# Patient Record
Sex: Female | Born: 1990 | Race: White | Hispanic: Yes | Marital: Married | Smoking: Never smoker
Health system: Southern US, Community
[De-identification: ages and names within clinical notes are randomized; demographics above are authoritative.]

## PROBLEM LIST (undated history)

## (undated) ENCOUNTER — Inpatient Hospital Stay (HOSPITAL_COMMUNITY): Payer: Self-pay

## (undated) DIAGNOSIS — D649 Anemia, unspecified: Secondary | ICD-10-CM

## (undated) DIAGNOSIS — N83209 Unspecified ovarian cyst, unspecified side: Secondary | ICD-10-CM

## (undated) DIAGNOSIS — Z8759 Personal history of other complications of pregnancy, childbirth and the puerperium: Secondary | ICD-10-CM

## (undated) HISTORY — PX: CHOLECYSTECTOMY: SHX55

## (undated) HISTORY — PX: OVARIAN CYST SURGERY: SHX726

---

## 2018-08-24 ENCOUNTER — Encounter (HOSPITAL_COMMUNITY): Payer: Self-pay | Admitting: *Deleted

## 2018-08-24 ENCOUNTER — Inpatient Hospital Stay (HOSPITAL_COMMUNITY): Payer: Self-pay

## 2018-08-24 ENCOUNTER — Inpatient Hospital Stay (HOSPITAL_COMMUNITY)
Admission: AD | Admit: 2018-08-24 | Discharge: 2018-08-24 | Disposition: A | Discharge status: Home / Self Care | Payer: Self-pay | Source: Ambulatory Visit | Attending: Obstetrics and Gynecology | Admitting: Obstetrics and Gynecology

## 2018-08-24 DIAGNOSIS — O209 Hemorrhage in early pregnancy, unspecified: Secondary | ICD-10-CM

## 2018-08-24 DIAGNOSIS — O3680X0 Pregnancy with inconclusive fetal viability, not applicable or unspecified: Secondary | ICD-10-CM

## 2018-08-24 DIAGNOSIS — O200 Threatened abortion: Secondary | ICD-10-CM | POA: Insufficient documentation

## 2018-08-24 DIAGNOSIS — N83201 Unspecified ovarian cyst, right side: Secondary | ICD-10-CM | POA: Insufficient documentation

## 2018-08-24 DIAGNOSIS — Z87891 Personal history of nicotine dependence: Secondary | ICD-10-CM | POA: Insufficient documentation

## 2018-08-24 DIAGNOSIS — Z3A08 8 weeks gestation of pregnancy: Secondary | ICD-10-CM | POA: Insufficient documentation

## 2018-08-24 DIAGNOSIS — O3481 Maternal care for other abnormalities of pelvic organs, first trimester: Secondary | ICD-10-CM | POA: Insufficient documentation

## 2018-08-24 HISTORY — DX: Unspecified ovarian cyst, unspecified side: N83.209

## 2018-08-24 LAB — WET PREP, GENITAL
Clue Cells Wet Prep HPF POC: NONE SEEN
Sperm: NONE SEEN
TRICH WET PREP: NONE SEEN
Yeast Wet Prep HPF POC: NONE SEEN

## 2018-08-24 LAB — CBC
HEMATOCRIT: 38.3 % (ref 36.0–46.0)
HEMOGLOBIN: 13.1 g/dL (ref 12.0–15.0)
MCH: 28.8 pg (ref 26.0–34.0)
MCHC: 34.2 g/dL (ref 30.0–36.0)
MCV: 84.2 fL (ref 80.0–100.0)
NRBC: 0 % (ref 0.0–0.2)
Platelets: 267 10*3/uL (ref 150–400)
RBC: 4.55 MIL/uL (ref 3.87–5.11)
RDW: 15.7 % — ABNORMAL HIGH (ref 11.5–15.5)
WBC: 11 10*3/uL — AB (ref 4.0–10.5)

## 2018-08-24 LAB — URINALYSIS, ROUTINE W REFLEX MICROSCOPIC
BILIRUBIN URINE: NEGATIVE
GLUCOSE, UA: NEGATIVE mg/dL
KETONES UR: 5 mg/dL — AB
Nitrite: NEGATIVE
Protein, ur: 30 mg/dL — AB
Specific Gravity, Urine: 1.03 (ref 1.005–1.030)
pH: 5 (ref 5.0–8.0)

## 2018-08-24 LAB — HCG, QUANTITATIVE, PREGNANCY: hCG, Beta Chain, Quant, S: 280 m[IU]/mL — ABNORMAL HIGH (ref <5)

## 2018-08-24 MED ORDER — ALUM & MAG HYDROXIDE-SIMETH 200-200-20 MG/5ML PO SUSP
30.0000 mL | Freq: Once | ORAL | Status: AC
  Administered 2018-08-24: 30 mL via ORAL
  Filled 2018-08-24: qty 30

## 2018-08-24 NOTE — Discharge Instructions (Signed)

## 2018-08-24 NOTE — MAU Provider Note (Signed)
  History     CSN: [id number]  Arrival date and time: 08/24/18 1655   First Provider Initiated Contact with Patient 08/24/18 1749      Chief Complaint  Patient presents with  . Abdominal Pain  . Vaginal Bleeding   HPI 27 yo G2P1001 8 1/7 weeks by LMP presents to MAU with c/o vaginal bleeding and epigastric pain. Pt had + UPT at [facility] pregnancy on 08/17/18.  She reports epigastric pain x 1 month. Vaginal bleeding started yesterday. Has tried OTC meds for epigastric pain without relief. Describes pain as constant and sharp. Denies N/V.   H/O TSVD x 1  Denies chronic medications or medical probelms  Past Medical History:  Diagnosis Date  . Ovarian cyst     Past Surgical History:  Procedure Laterality Date  . OVARIAN CYST SURGERY      History reviewed. No pertinent family history.  Social History   Tobacco Use  . Smoking status: Former Games developer  . Smokeless tobacco: Never Used  Substance Use Topics  . Alcohol use: Never    Frequency: Never  . Drug use: Never    Allergies: Not on File  No medications prior to admission.    Review of Systems Physical Exam   Blood pressure (!) 141/87, pulse 84, temperature 98.1 F (36.7 C), temperature source Oral, resp. rate 18, weight 68.5 kg, last menstrual period 06/28/2018.  Physical Exam WDWN female in NAD Lungs clear Heart RRR Abd soft + BS epigastric tenderness no rebound or guarding Pelvic, Nl EGBUS, min blood noted in vaginal vault and coming form os, cultures obtained uterus @ 8 week size, mobile non tender no masses or tenderness MAU Course  Procedures       Care turned over to R. Jeannene Patella 08/24/2018, 6:02 PM

## 2018-08-24 NOTE — MAU Provider Note (Signed)
History     CSN: [id number]  Arrival date and time: 08/24/18 1655   First Provider Initiated Contact with Patient 08/24/18 1749      Chief Complaint  Patient presents with  . Abdominal Pain  . Vaginal Bleeding   HPI  Ms.  Shelly Reed is a 27 y.o. year old G22P1001 female at [redacted]w[redacted]d weeks gestation who presents to MAU reporting RUQ x 2 days. She has been taking Tylenol and "Flanax" for pain with minimal relief. She started VB yesterday that saturated 3 pads yesterday and today. She had a (+) HPT and verified by [facility] (UPT and early U/S documentation brought by patient) on 08/17/2018.   Past Medical History:  Diagnosis Date  . Ovarian cyst     Past Surgical History:  Procedure Laterality Date  . OVARIAN CYST SURGERY      History reviewed. No pertinent family history.  Social History   Tobacco Use  . Smoking status: Former Games developer  . Smokeless tobacco: Never Used  Substance Use Topics  . Alcohol use: Never    Frequency: Never  . Drug use: Never    Allergies: No Known Allergies  No medications prior to admission.    Review of Systems  Constitutional: Negative.   HENT: Negative.   Eyes: Negative.   Respiratory: Negative.   Cardiovascular: Negative.   Gastrointestinal: Positive for abdominal pain (RUQ pain).  Endocrine: Negative.   Genitourinary: Positive for pelvic pain and vaginal bleeding.  Allergic/Immunologic: Negative.   Neurological: Negative.   Hematological: Negative.   Psychiatric/Behavioral: Negative.    Physical Exam   Blood pressure 128/63, pulse 84, temperature 98.1 F (36.7 C), temperature source Oral, resp. rate 18, weight 68.5 kg, last menstrual period 06/28/2018.  Physical Exam Performed by Dr. Alysia Penna -- please see his MAU note MAU Course  Procedures  MDM CCUA UPT CBC ABO/Rh HCG Wet Prep GC/CT -- pending HIV -- pending OB < 14 wks [place] with TV  Results for orders placed or performed during the hospital  encounter of 08/24/18 (from the past 24 hour(s))  Urinalysis, Routine w reflex microscopic     Status: Abnormal   Collection Time: 08/24/18  5:33 PM  Result Value Ref Range   Color, Urine YELLOW YELLOW   APPearance HAZY (A) CLEAR   Specific Gravity, Urine 1.030 1.005 - 1.030   pH 5.0 5.0 - 8.0   Glucose, UA NEGATIVE NEGATIVE mg/dL   Hgb urine dipstick LARGE (A) NEGATIVE   Bilirubin Urine NEGATIVE NEGATIVE   Ketones, ur 5 (A) NEGATIVE mg/dL   Protein, ur 30 (A) NEGATIVE mg/dL   Nitrite NEGATIVE NEGATIVE   Leukocytes, UA SMALL (A) NEGATIVE   RBC / HPF 11-20 0 - 5 RBC/hpf   WBC, UA 11-20 0 - 5 WBC/hpf   Bacteria, UA RARE (A) NONE SEEN   Squamous Epithelial / LPF 11-20 0 - 5   Mucus PRESENT    Amorphous Crystal PRESENT   Wet prep, genital     Status: Abnormal   Collection Time: 08/24/18  6:03 PM  Result Value Ref Range   Yeast Wet Prep HPF POC NONE SEEN NONE SEEN   Trich, Wet Prep NONE SEEN NONE SEEN   Clue Cells Wet Prep HPF POC NONE SEEN NONE SEEN   WBC, Wet Prep HPF POC MANY (A) NONE SEEN   Sperm NONE SEEN   CBC     Status: Abnormal   Collection Time: 08/24/18  6:03 PM  Result Value Ref Range  WBC 11.0 (H) 4.0 - 10.5 K/uL   RBC 4.55 3.87 - 5.11 MIL/uL   Hemoglobin 13.1 12.0 - 15.0 g/dL   HCT 40.9 81.1 - 91.4 %   MCV 84.2 80.0 - 100.0 fL   MCH 28.8 26.0 - 34.0 pg   MCHC 34.2 30.0 - 36.0 g/dL   RDW 78.2 (H) 95.6 - 21.3 %   Platelets 267 150 - 400 K/uL   nRBC 0.0 0.0 - 0.2 %  ABO/Rh     Status: None (Preliminary result)   Collection Time: 08/24/18  6:03 PM  Result Value Ref Range   ABO/RH(D)      O POS Performed at [facility], [address], [place], [place] [address]   hCG, quantitative, pregnancy     Status: Abnormal   Collection Time: 08/24/18  6:03 PM  Result Value Ref Range   hCG, Beta Chain, Quant, S 280 (H) <5 mIU/mL    US Ob Less Than 14 Weeks With Ob Transvaginal  Result Date: 08/24/2018 CLINICAL DATA:  Vaginal bleeding. Quantitative beta HCG is  280. LMP was 06/28/2018. By LMP patient is 8 weeks 1 day. EDC by LMP is 04/04/2019. EXAM: OBSTETRIC <14 WK [place] AND TRANSVAGINAL OB US TECHNIQUE: Both transabdominal and transvaginal ultrasound examinations were performed for complete evaluation of the gestation as well as the maternal uterus, adnexal regions, and pelvic cul-de-sac. Transvaginal technique was performed to assess early pregnancy. COMPARISON:  None. FINDINGS: Intrauterine gestational sac: None Yolk sac:  Not Visualized. Embryo:  Not Visualized. Cardiac Activity: Not Visualized. Heart Rate: Absent bpm Subchorionic hemorrhage:  None visualized. Maternal uterus/adnexae: RIGHT ovary is 2.6 x 2.0 x 1.0 centimeters. A small para ovarian cyst is identified in the RIGHT adnexal region, 1.7 centimeters. There is no evidence for ectopic pregnancy. LEFT ovary is 2.7 x 1.6 x 2.0 centimeters. There is no free pelvic fluid. IMPRESSION: 1.  Pregnancy of unknown location. Considerations include completed spontaneous abortion, early intrauterine pregnancy or early ectopic pregnancy. Serial quantitative beta HCG values and follow-up ultrasound are recommended as appropriate to document progression of and location of pregnancy. Ectopic pregnancy has not been excluded. 2. Small RIGHT para ovarian cyst. No ultrasound evidence for ectopic pregnancy. Electronically Signed   By: Norva Pavlov M.D.   On: 08/24/2018 19:52     Assessment and Plan  Pregnancy of unknown anatomic location  - Repeat HCG scheduled for Friday 08/27/2018 - Information provided on ectopic pregnancy   Bleeding in early pregnancy  - Advised to return to MAU for bleeding that soaks a pad every hour or for pain not relieved by Tylenol - Threatened miscarriage  - Discharge home - Keep scheduled appt on 10/25 for Rpt HCG - Results reviewed and d/c instructions given with assistance from Orlan Leavens, Spanish Interpreter - Patient verbalized an understanding of the plan of care and agrees.     Raelyn Mora, MSN, CNM 08/24/2018, 8:06 PM

## 2018-08-24 NOTE — MAU Note (Addendum)
Pt C/O RUQ pain that started two days ago, has taken tylenol & "flanax" for pain, helped a little.  Started bleeding yesterday, saturated 3 pads yesterday & today. Denies pelvic pain.  Had pos HPT, also pregnancy verification from [facility], pt has pregnancy verification letter with her.

## 2018-08-25 LAB — GC/CHLAMYDIA PROBE AMP (~~LOC~~) NOT AT ARMC
Chlamydia: NEGATIVE
NEISSERIA GONORRHEA: NEGATIVE

## 2018-08-25 LAB — ABO/RH: ABO/RH(D): O POS

## 2018-08-25 LAB — HIV ANTIBODY (ROUTINE TESTING W REFLEX): HIV SCREEN 4TH GENERATION: NONREACTIVE

## 2018-08-27 ENCOUNTER — Ambulatory Visit (INDEPENDENT_AMBULATORY_CARE_PROVIDER_SITE_OTHER): Payer: Self-pay | Admitting: General Practice

## 2018-08-27 DIAGNOSIS — O3680X0 Pregnancy with inconclusive fetal viability, not applicable or unspecified: Secondary | ICD-10-CM

## 2018-08-27 LAB — HCG, QUANTITATIVE, PREGNANCY: hCG, Beta Chain, Quant, S: 47 m[IU]/mL — ABNORMAL HIGH (ref <5)

## 2018-08-27 NOTE — Progress Notes (Signed)
Patient presents to office today for stat bhcg. Patient reports spotting this morning and no pain. Discussed with patient we are monitoring your bhcg levels today and asked she wait in lobby for results/updated plan of care. Patient verbalized understanding & had no questions at this time.  Reviewed results with Dr Erin Fulling who finds decreasing bhcg levels indicative of SAB, patient should have follow up bhcg in 2 weeks.  Informed patient of results & discussed recommended follow up in 2 weeks. Patient verbalized understanding to all & had no questions. Lorinda Creed used for interpreter today.  Marylynn Pearson RN BSN

## 2018-09-10 ENCOUNTER — Other Ambulatory Visit: Payer: Self-pay

## 2018-09-10 NOTE — Progress Notes (Unsigned)
beta

## 2019-02-20 ENCOUNTER — Emergency Department (HOSPITAL_BASED_OUTPATIENT_CLINIC_OR_DEPARTMENT_OTHER)
Admission: EM | Admit: 2019-02-20 | Discharge: 2019-02-20 | Disposition: A | Discharge status: Home / Self Care | Payer: Self-pay | Attending: Emergency Medicine | Admitting: Emergency Medicine

## 2019-02-20 ENCOUNTER — Emergency Department (HOSPITAL_BASED_OUTPATIENT_CLINIC_OR_DEPARTMENT_OTHER): Payer: Self-pay

## 2019-02-20 ENCOUNTER — Other Ambulatory Visit: Payer: Self-pay

## 2019-02-20 ENCOUNTER — Encounter (HOSPITAL_BASED_OUTPATIENT_CLINIC_OR_DEPARTMENT_OTHER): Payer: Self-pay | Admitting: *Deleted

## 2019-02-20 DIAGNOSIS — Z87891 Personal history of nicotine dependence: Secondary | ICD-10-CM | POA: Insufficient documentation

## 2019-02-20 DIAGNOSIS — R1011 Right upper quadrant pain: Secondary | ICD-10-CM | POA: Insufficient documentation

## 2019-02-20 HISTORY — DX: Personal history of other complications of pregnancy, childbirth and the puerperium: Z87.59

## 2019-02-20 LAB — URINALYSIS, MICROSCOPIC (REFLEX)

## 2019-02-20 LAB — CBC WITH DIFFERENTIAL/PLATELET
Abs Immature Granulocytes: 0.02 10*3/uL (ref 0.00–0.07)
Basophils Absolute: 0 10*3/uL (ref 0.0–0.1)
Basophils Relative: 0 %
Eosinophils Absolute: 0.1 10*3/uL (ref 0.0–0.5)
Eosinophils Relative: 2 %
HCT: 35.3 % — ABNORMAL LOW (ref 36.0–46.0)
Hemoglobin: 11.6 g/dL — ABNORMAL LOW (ref 12.0–15.0)
Immature Granulocytes: 0 %
Lymphocytes Relative: 20 %
Lymphs Abs: 1.6 10*3/uL (ref 0.7–4.0)
MCH: 29.2 pg (ref 26.0–34.0)
MCHC: 32.9 g/dL (ref 30.0–36.0)
MCV: 88.9 fL (ref 80.0–100.0)
Monocytes Absolute: 0.6 10*3/uL (ref 0.1–1.0)
Monocytes Relative: 8 %
Neutro Abs: 5.6 10*3/uL (ref 1.7–7.7)
Neutrophils Relative %: 70 %
Platelets: 265 10*3/uL (ref 150–400)
RBC: 3.97 MIL/uL (ref 3.87–5.11)
RDW: 12.5 % (ref 11.5–15.5)
WBC: 8 10*3/uL (ref 4.0–10.5)
nRBC: 0 % (ref 0.0–0.2)

## 2019-02-20 LAB — URINALYSIS, ROUTINE W REFLEX MICROSCOPIC
Bilirubin Urine: NEGATIVE
Glucose, UA: NEGATIVE mg/dL
Hgb urine dipstick: NEGATIVE
Ketones, ur: NEGATIVE mg/dL
Nitrite: NEGATIVE
Protein, ur: NEGATIVE mg/dL
Specific Gravity, Urine: 1.025 (ref 1.005–1.030)
pH: 6.5 (ref 5.0–8.0)

## 2019-02-20 LAB — COMPREHENSIVE METABOLIC PANEL
ALT: 14 U/L (ref 0–44)
AST: 15 U/L (ref 15–41)
Albumin: 3.6 g/dL (ref 3.5–5.0)
Alkaline Phosphatase: 70 U/L (ref 38–126)
Anion gap: 5 (ref 5–15)
BUN: 11 mg/dL (ref 6–20)
CO2: 22 mmol/L (ref 22–32)
Calcium: 8.6 mg/dL — ABNORMAL LOW (ref 8.9–10.3)
Chloride: 108 mmol/L (ref 98–111)
Creatinine, Ser: 0.46 mg/dL (ref 0.44–1.00)
GFR calc Af Amer: >60 mL/min (ref >60)
GFR calc non Af Amer: >60 mL/min (ref >60)
Glucose, Bld: 108 mg/dL — ABNORMAL HIGH (ref 70–99)
Potassium: 3.4 mmol/L — ABNORMAL LOW (ref 3.5–5.1)
Sodium: 135 mmol/L (ref 135–145)
Total Bilirubin: 0.2 mg/dL — ABNORMAL LOW (ref 0.3–1.2)
Total Protein: 6.5 g/dL (ref 6.5–8.1)

## 2019-02-20 LAB — LIPASE, BLOOD: Lipase: 45 U/L (ref 11–51)

## 2019-02-20 LAB — PREGNANCY, URINE: Preg Test, Ur: NEGATIVE

## 2019-02-20 MED ORDER — ALUM & MAG HYDROXIDE-SIMETH 200-200-20 MG/5ML PO SUSP
30.0000 mL | Freq: Once | ORAL | Status: AC
  Administered 2019-02-20: 30 mL via ORAL
  Filled 2019-02-20: qty 30

## 2019-02-20 MED ORDER — LIDOCAINE VISCOUS HCL 2 % MT SOLN
15.0000 mL | Freq: Once | OROMUCOSAL | Status: AC
  Administered 2019-02-20: 15 mL via ORAL
  Filled 2019-02-20: qty 15

## 2019-02-20 MED ORDER — SUCRALFATE 1 GM/10ML PO SUSP: 1.0000 g | Freq: Three times a day (TID) | ORAL | 0 refills | Status: DC

## 2019-02-20 MED ORDER — ONDANSETRON HCL 4 MG/2ML IJ SOLN
4.0000 mg | Freq: Once | INTRAMUSCULAR | Status: DC
  Filled 2019-02-20: qty 2

## 2019-02-20 MED ORDER — KETOROLAC TROMETHAMINE 30 MG/ML IJ SOLN
30.0000 mg | Freq: Once | INTRAMUSCULAR | Status: DC
  Filled 2019-02-20: qty 1

## 2019-02-20 NOTE — Discharge Instructions (Signed)
You can take Tylenol or Ibuprofen as directed for pain. You can alternate Tylenol and Ibuprofen every 4 hours. If you take Tylenol at 1pm, then you can take Ibuprofen at 5pm. Then you can take Tylenol again at 9pm.   Follow up with the referred [facility].   Follow up with your OB/GYN.   Return to the Emergency Department for any worsening pain, nausea/vomiting, chest pain, difficulty breathing, or any other worsening or concerning symptoms.

## 2019-02-20 NOTE — ED Provider Notes (Signed)
[facility] EMERGENCY DEPARTMENT Provider Note   CSN: [id number] Arrival date & time: 02/20/19  2106    History   Chief Complaint Chief Complaint  Patient presents with   Abdominal Pain    HPI Shelly Reed is a 28 y.o. female with PMH/o ovarian cyst who presents for evaluation of who presents for evaluation of RUQ abdominal pain that began at approximately 5pm this evening. She states that her pain began after eating some spicy food. She states since 5pm the pain has been constant. She took tylenol and ibuprofen with no relief of symptoms. She has not had any nausea/vomiting. She has not had any diarrhea. She denies any sick contacts. Patient states she has had this pain intermittent about 10 different times. She states it always comes after eating. Today her pain is much worse. Patient denies any fevers, CP, SOB, diarrhea, nausea/vomiting, vaginal bleeding, dysuria, hematuria.       The history is provided by the patient. A language interpreter was used.    Past Medical History:  Diagnosis Date   Miscarriage within last 12 months    Ovarian cyst     Patient Active Problem List   Diagnosis Date Noted   Pregnancy of unknown anatomic location 08/24/2018    Past Surgical History:  Procedure Laterality Date   OVARIAN CYST SURGERY       OB History    Gravida  2   Para  1   Term  1   Preterm      AB      Living  1     SAB      TAB      Ectopic      Multiple      Live Births               Home Medications    Prior to Admission medications   Medication Sig Start Date End Date Taking? Authorizing Provider  acetaminophen (TYLENOL) 325 MG tablet Take 650 mg by mouth every 6 (six) hours as needed.   Yes [provider]  ibuprofen (ADVIL) 600 MG tablet Take 600 mg by mouth every 6 (six) hours as needed.   Yes [provider]  sucralfate (CARAFATE) 1 GM/10ML suspension Take 10 mLs (1 g total) by mouth 4 (four) times  daily -  with meals and at bedtime. 02/20/19   Maxwell Caul, PA-C    Family History No family history on file.  Social History Social History   Tobacco Use   Smoking status: Former Smoker   Smokeless tobacco: Never Used  Substance Use Topics   Alcohol use: Never    Frequency: Never   Drug use: Never     Allergies   Patient has no known allergies.   Review of Systems Review of Systems  Constitutional: Negative for fever.  Respiratory: Negative for cough and shortness of breath.   Cardiovascular: Negative for chest pain.  Gastrointestinal: Positive for abdominal pain. Negative for nausea and vomiting.  Genitourinary: Negative for dysuria and hematuria.  Neurological: Negative for headaches.  All other systems reviewed and are negative.    Physical Exam Updated Vital Signs BP 111/72 (BP Location: Right Arm)    Pulse 80    Temp 98.4 F (36.9 C) (Oral)    Resp 16    LMP 06/28/2018 Comment: recent pregnancy   SpO2 100%    Breastfeeding No   Physical Exam Vitals signs and nursing note reviewed.  Constitutional:  Appearance: Normal appearance. She is well-developed.  HENT:     Head: Normocephalic and atraumatic.  Eyes:     General: Lids are normal.     Conjunctiva/sclera: Conjunctivae normal.     Pupils: Pupils are equal, round, and reactive to light.  Neck:     Musculoskeletal: Full passive range of motion without pain.  Cardiovascular:     Rate and Rhythm: Normal rate and regular rhythm.     Pulses: Normal pulses.     Heart sounds: Normal heart sounds. No murmur. No friction rub. No gallop.   Pulmonary:     Effort: Pulmonary effort is normal.     Breath sounds: Normal breath sounds.     Comments: Lungs clear to auscultation bilaterally.  Symmetric chest rise.  No wheezing, rales, rhonchi. Abdominal:     Palpations: Abdomen is soft. Abdomen is not rigid.     Tenderness: There is abdominal tenderness in the right upper quadrant. There is no guarding.       Comments: Abdomen is soft, nondistended.  Tenderness noted to the right upper quadrant.  No CVA tenderness bilaterally.  Musculoskeletal: Normal range of motion.  Skin:    General: Skin is warm and dry.     Capillary Refill: Capillary refill takes less than 2 seconds.  Neurological:     Mental Status: She is alert and oriented to person, place, and time.  Psychiatric:        Speech: Speech normal.      ED Treatments / Results  Labs (all labs ordered are listed, but only abnormal results are displayed) Labs Reviewed  URINALYSIS, ROUTINE W REFLEX MICROSCOPIC - Abnormal; Notable for the following components:      Result Value   Leukocytes,Ua SMALL (*)    All other components within normal limits  URINALYSIS, MICROSCOPIC (REFLEX) - Abnormal; Notable for the following components:   Bacteria, UA MANY (*)    All other components within normal limits  COMPREHENSIVE METABOLIC PANEL - Abnormal; Notable for the following components:   Potassium 3.4 (*)    Glucose, Bld 108 (*)    Calcium 8.6 (*)    Total Bilirubin 0.2 (*)    All other components within normal limits  CBC WITH DIFFERENTIAL/PLATELET - Abnormal; Notable for the following components:   Hemoglobin 11.6 (*)    HCT 35.3 (*)    All other components within normal limits  PREGNANCY, URINE  LIPASE, BLOOD    EKG None  Radiology Ct Abdomen Pelvis Wo Contrast  Result Date: 02/20/2019 CLINICAL DATA:  Right upper quadrant pain, nausea EXAM: CT ABDOMEN AND PELVIS WITHOUT CONTRAST TECHNIQUE: Multidetector CT imaging of the abdomen and pelvis was performed following the standard protocol without IV contrast. COMPARISON:  None. FINDINGS: Lower chest: Lung bases are clear. No effusions. Heart is normal size. Hepatobiliary: No focal hepatic abnormality. Gallbladder unremarkable. Pancreas: No focal abnormality or ductal dilatation. Spleen: No focal abnormality.  Normal size. Adrenals/Urinary Tract: No adrenal abnormality. No focal  renal abnormality. No stones or hydronephrosis. Urinary bladder is unremarkable. Stomach/Bowel: Normal appendix. Stomach, large and small bowel grossly unremarkable. Vascular/Lymphatic: No evidence of aneurysm or adenopathy. Reproductive: Uterus and adnexa unremarkable.  No mass. Other: No free fluid or free air. Musculoskeletal: No acute bony abnormality. IMPRESSION: No acute findings in the abdomen or pelvis. Electronically Signed   By: Charlett NoseKevin  Dover M.D.   On: 02/20/2019 22:32    Procedures Procedures (including critical care time)  Medications Ordered in ED Medications  ondansetron (ZOFRAN)  injection 4 mg (4 mg Intravenous Refused 02/20/19 2209)  ketorolac (TORADOL) 30 MG/ML injection 30 mg (30 mg Intravenous Refused 02/20/19 2245)  alum & mag hydroxide-simeth (MAALOX/MYLANTA) 200-200-20 MG/5ML suspension 30 mL (30 mLs Oral Given 02/20/19 2157)    And  lidocaine (XYLOCAINE) 2 % viscous mouth solution 15 mL (15 mLs Oral Given 02/20/19 2157)     Initial Impression / Assessment and Plan / ED Course  I have reviewed the triage vital signs and the nursing notes.  Pertinent labs & imaging results that were available during my care of the patient were reviewed by me and considered in my medical decision making (see chart for details).        28 y.o. F who presents for evaluation of right upper quadrant abdominal pain that began at 5 PM.  She states she has had this pain before but never this bad.  No associated nausea/vomiting.  No chest pain, difficulty breathing.  No fevers. Patient is afebrile, non-toxic appearing, sitting comfortably on examination table. Vital signs reviewed and stable.  On exam, she is tender right upper quadrant.  No CVA tenderness.  Consider hepatobiliary etiology.  Also consider GU etiology.  Will plan for labs.  CBC without any significant leukocytosis.  Urine pregnancy negative.  UA does show small leukocytes. CMP potassium of 3.4.  Otherwise unremarkable.  Lipase  unremarkable.  CT of abdomen pelvis shows no acute abnormalities.  Patient refused any additional analgesics.  Patient denies any pain at this time.  Discussed results with patient.  Repeat abdominal exam shows no tenderness in the right upper quadrant.  Patient reports feeling better.  I discussed with patient that this may be related to GERD given that it happens intermittently after eating food.  We will plan to do a trial of Carafate.  Vital signs are stable. At this time, patient exhibits no emergent life-threatening condition that require further evaluation in ED or admission. Patient had ample opportunity for questions and discussion. All patient's questions were answered with full understanding. Strict return precautions discussed. Patient expresses understanding and agreement to plan.   Portions of this note were generated with Scientist, clinical (histocompatibility and immunogenetics). Dictation errors may occur despite best attempts at proofreading.   Final Clinical Impressions(s) / ED Diagnoses   Final diagnoses:  Right upper quadrant abdominal pain    ED Discharge Orders         Ordered    sucralfate (CARAFATE) 1 GM/10ML suspension  3 times daily with meals & bedtime     02/20/19 2302           Maxwell Caul, PA-C 02/20/19 2304    Charlynne Pander, MD 02/25/19 [phone]

## 2019-02-20 NOTE — ED Triage Notes (Addendum)
Pt reports RUQ pain and nausea since 5pm. Pt triaged using video interpreter [phone]. She also states she took tylenol and advil "3 pills" prior to arrival. She had a miscarriage recently and has been bleeding for the last 3 weeks.

## 2019-02-20 NOTE — ED Notes (Signed)
Pt experiencing nausea after GI cocktail administration. Verbal order given for zofran, pt refused and stated she felt better after RN retrieved dose. Encouraged patient to call nurse if nausea returns. Will continue to monitor.

## 2019-02-20 NOTE — ED Notes (Signed)
Pt refusing toradol at this time. Denies pain. Will continue to monitor

## 2019-02-27 ENCOUNTER — Emergency Department (HOSPITAL_BASED_OUTPATIENT_CLINIC_OR_DEPARTMENT_OTHER)
Admission: EM | Admit: 2019-02-27 | Discharge: 2019-02-27 | Disposition: A | Discharge status: Home / Self Care | Payer: Self-pay | Attending: Emergency Medicine | Admitting: Emergency Medicine

## 2019-02-27 ENCOUNTER — Other Ambulatory Visit: Payer: Self-pay

## 2019-02-27 ENCOUNTER — Encounter (HOSPITAL_BASED_OUTPATIENT_CLINIC_OR_DEPARTMENT_OTHER): Payer: Self-pay | Admitting: Emergency Medicine

## 2019-02-27 ENCOUNTER — Emergency Department (HOSPITAL_BASED_OUTPATIENT_CLINIC_OR_DEPARTMENT_OTHER): Payer: Self-pay

## 2019-02-27 DIAGNOSIS — K8020 Calculus of gallbladder without cholecystitis without obstruction: Secondary | ICD-10-CM | POA: Insufficient documentation

## 2019-02-27 DIAGNOSIS — Z79899 Other long term (current) drug therapy: Secondary | ICD-10-CM | POA: Insufficient documentation

## 2019-02-27 DIAGNOSIS — Z87891 Personal history of nicotine dependence: Secondary | ICD-10-CM | POA: Insufficient documentation

## 2019-02-27 DIAGNOSIS — R1011 Right upper quadrant pain: Secondary | ICD-10-CM

## 2019-02-27 LAB — CBC WITH DIFFERENTIAL/PLATELET
Abs Immature Granulocytes: 0.02 10*3/uL (ref 0.00–0.07)
Basophils Absolute: 0 10*3/uL (ref 0.0–0.1)
Basophils Relative: 0 %
Eosinophils Absolute: 0.1 10*3/uL (ref 0.0–0.5)
Eosinophils Relative: 1 %
HCT: 36.6 % (ref 36.0–46.0)
Hemoglobin: 11.7 g/dL — ABNORMAL LOW (ref 12.0–15.0)
Immature Granulocytes: 0 %
Lymphocytes Relative: 23 %
Lymphs Abs: 2.2 10*3/uL (ref 0.7–4.0)
MCH: 28.1 pg (ref 26.0–34.0)
MCHC: 32 g/dL (ref 30.0–36.0)
MCV: 88 fL (ref 80.0–100.0)
Monocytes Absolute: 0.9 10*3/uL (ref 0.1–1.0)
Monocytes Relative: 9 %
Neutro Abs: 6.4 10*3/uL (ref 1.7–7.7)
Neutrophils Relative %: 67 %
Platelets: 285 10*3/uL (ref 150–400)
RBC: 4.16 MIL/uL (ref 3.87–5.11)
RDW: 12.4 % (ref 11.5–15.5)
WBC: 9.6 10*3/uL (ref 4.0–10.5)
nRBC: 0 % (ref 0.0–0.2)

## 2019-02-27 LAB — COMPREHENSIVE METABOLIC PANEL
ALT: 12 U/L (ref 0–44)
AST: 14 U/L — ABNORMAL LOW (ref 15–41)
Albumin: 3.7 g/dL (ref 3.5–5.0)
Alkaline Phosphatase: 72 U/L (ref 38–126)
Anion gap: 7 (ref 5–15)
BUN: 15 mg/dL (ref 6–20)
CO2: 21 mmol/L — ABNORMAL LOW (ref 22–32)
Calcium: 9 mg/dL (ref 8.9–10.3)
Chloride: 108 mmol/L (ref 98–111)
Creatinine, Ser: 0.48 mg/dL (ref 0.44–1.00)
GFR calc Af Amer: >60 mL/min (ref >60)
GFR calc non Af Amer: >60 mL/min (ref >60)
Glucose, Bld: 120 mg/dL — ABNORMAL HIGH (ref 70–99)
Potassium: 3.6 mmol/L (ref 3.5–5.1)
Sodium: 136 mmol/L (ref 135–145)
Total Bilirubin: 0.4 mg/dL (ref 0.3–1.2)
Total Protein: 7.1 g/dL (ref 6.5–8.1)

## 2019-02-27 LAB — LIPASE, BLOOD: Lipase: 44 U/L (ref 11–51)

## 2019-02-27 MED ORDER — HYDROCODONE-ACETAMINOPHEN 5-325 MG PO TABS: 1.0000 | ORAL_TABLET | Freq: Four times a day (QID) | ORAL | 0 refills | Status: DC | PRN

## 2019-02-27 MED ORDER — PROMETHAZINE HCL 25 MG/ML IJ SOLN
12.5000 mg | Freq: Once | INTRAMUSCULAR | Status: AC
  Administered 2019-02-27: 12.5 mg via INTRAVENOUS

## 2019-02-27 MED ORDER — FENTANYL CITRATE (PF) 100 MCG/2ML IJ SOLN
100.0000 ug | Freq: Once | INTRAMUSCULAR | Status: AC
  Administered 2019-02-27: 02:00:00 100 ug via INTRAVENOUS
  Filled 2019-02-27: qty 2

## 2019-02-27 MED ORDER — FENTANYL CITRATE (PF) 100 MCG/2ML IJ SOLN
50.0000 ug | INTRAMUSCULAR | Status: DC | PRN
  Filled 2019-02-27: qty 2

## 2019-02-27 MED ORDER — FENTANYL CITRATE (PF) 100 MCG/2ML IJ SOLN
50.0000 ug | Freq: Once | INTRAMUSCULAR | Status: AC
  Administered 2019-02-27: 50 ug via INTRAVENOUS

## 2019-02-27 MED ORDER — PROMETHAZINE HCL 25 MG/ML IJ SOLN
INTRAMUSCULAR | Status: AC
  Filled 2019-02-27: qty 1

## 2019-02-27 MED ORDER — SODIUM CHLORIDE 0.9 % IV SOLN
Freq: Once | INTRAVENOUS | Status: AC
  Administered 2019-02-27: 02:00:00 via INTRAVENOUS

## 2019-02-27 MED ORDER — ONDANSETRON HCL 4 MG/2ML IJ SOLN
4.0000 mg | Freq: Once | INTRAMUSCULAR | Status: AC
  Administered 2019-02-27: 4 mg via INTRAVENOUS
  Filled 2019-02-27: qty 2

## 2019-02-27 NOTE — ED Provider Notes (Signed)
[facility] Provider Note: Lowella Dell, MD, FACEP  CSN: [id number] MRN: [id number] ARRIVAL: 02/27/19 at 0115 ROOM: MH06/MH06   CHIEF COMPLAINT  Abdominal Pain   HISTORY OF PRESENT ILLNESS  02/27/19 1:38 AM Shelly Reed is a 28 y.o. female was seen on the 19th of this month for right upper quadrant abdominal pain.  The pain occurred after eating.  A CT of the abdomen and pelvis was unremarkable.  She did not have an abdominal ultrasound.  She returns with persistent episodes of right upper quadrant pain, worse early in the morning and late at night.  This particular episode began about 3 hours ago after eating.  She rates the pain as severe.  The pain is worse with movement or palpation.  She denies fever, nausea, vomiting or diarrhea.   History was obtained through use of two-way video interpreter service.  Past Medical History:  Diagnosis Date  . Miscarriage within last 12 months   . Ovarian cyst     Past Surgical History:  Procedure Laterality Date  . OVARIAN CYST SURGERY      No family history on file.  Social History   Tobacco Use  . Smoking status: Former Games developer  . Smokeless tobacco: Never Used  Substance Use Topics  . Alcohol use: Never    Frequency: Never  . Drug use: Never    Prior to Admission medications   Medication Sig Start Date End Date Taking? Authorizing Provider  acetaminophen (TYLENOL) 325 MG tablet Take 650 mg by mouth every 6 (six) hours as needed.   Yes [provider]  ibuprofen (ADVIL) 600 MG tablet Take 600 mg by mouth every 6 (six) hours as needed.   Yes [provider]  sucralfate (CARAFATE) 1 GM/10ML suspension Take 10 mLs (1 g total) by mouth 4 (four) times daily -  with meals and at bedtime. 02/20/19  Yes Maxwell Caul, PA-C  HYDROcodone-acetaminophen (NORCO/VICODIN) 5-325 MG tablet Take 1-2 tablets by mouth every 6 (six) hours as needed for severe pain. 02/27/19   Gwyneth Sprout, MD     Allergies Patient has no known allergies.   REVIEW OF SYSTEMS  Negative except as noted here or in the History of Present Illness.   PHYSICAL EXAMINATION  Initial Vital Signs Blood pressure (!) 156/91, pulse (!) 104, temperature 98.6 F (37 C), temperature source Oral, resp. rate 18, last menstrual period 06/28/2018, SpO2 100 %.  Examination General: Well-developed, well-nourished female in no acute distress; appearance consistent with age of record HENT: normocephalic; atraumatic Eyes: pupils equal, round and reactive to light; extraocular muscles intact Neck: supple Heart: regular rate and rhythm Lungs: clear to auscultation bilaterally Abdomen: soft; nondistended; right upper quadrant tenderness; no masses or hepatosplenomegaly; bowel sounds present Extremities: No deformity; full range of motion; pulses normal Neurologic: Awake, alert and oriented; motor function intact in all extremities and symmetric; no facial droop Skin: Warm and dry Psychiatric: Normal mood and affect   RESULTS  Summary of this visit's results, reviewed by myself:   EKG Interpretation  Date/Time:    Ventricular Rate:    PR Interval:    QRS Duration:   QT Interval:    QTC Calculation:   R Axis:     Text Interpretation:        Laboratory Studies: Results for orders placed or performed during the hospital encounter of 02/27/19 (from the past 24 hour(s))  CBC with Differential/Platelet     Status: Abnormal   Collection Time: 02/27/19  2:07 AM  Result Value Ref Range   WBC 9.6 4.0 - 10.5 K/uL   RBC 4.16 3.87 - 5.11 MIL/uL   Hemoglobin 11.7 (L) 12.0 - 15.0 g/dL   HCT 45.4 09.8 - 11.9 %   MCV 88.0 80.0 - 100.0 fL   MCH 28.1 26.0 - 34.0 pg   MCHC 32.0 30.0 - 36.0 g/dL   RDW 14.7 82.9 - 56.2 %   Platelets 285 150 - 400 K/uL   nRBC 0.0 0.0 - 0.2 %   Neutrophils Relative % 67 %   Neutro Abs 6.4 1.7 - 7.7 K/uL   Lymphocytes Relative 23 %   Lymphs Abs 2.2 0.7 - 4.0 K/uL   Monocytes Relative  9 %   Monocytes Absolute 0.9 0.1 - 1.0 K/uL   Eosinophils Relative 1 %   Eosinophils Absolute 0.1 0.0 - 0.5 K/uL   Basophils Relative 0 %   Basophils Absolute 0.0 0.0 - 0.1 K/uL   Immature Granulocytes 0 %   Abs Immature Granulocytes 0.02 0.00 - 0.07 K/uL  Comprehensive metabolic panel     Status: Abnormal   Collection Time: 02/27/19  2:07 AM  Result Value Ref Range   Sodium 136 135 - 145 mmol/L   Potassium 3.6 3.5 - 5.1 mmol/L   Chloride 108 98 - 111 mmol/L   CO2 21 (L) 22 - 32 mmol/L   Glucose, Bld 120 (H) 70 - 99 mg/dL   BUN 15 6 - 20 mg/dL   Creatinine, Ser 1.30 0.44 - 1.00 mg/dL   Calcium 9.0 8.9 - 86.5 mg/dL   Total Protein 7.1 6.5 - 8.1 g/dL   Albumin 3.7 3.5 - 5.0 g/dL   AST 14 (L) 15 - 41 U/L   ALT 12 0 - 44 U/L   Alkaline Phosphatase 72 38 - 126 U/L   Total Bilirubin 0.4 0.3 - 1.2 mg/dL   GFR calc non Af Amer >60 >60 mL/min   GFR calc Af Amer >60 >60 mL/min   Anion gap 7 5 - 15  Lipase, blood     Status: None   Collection Time: 02/27/19  2:07 AM  Result Value Ref Range   Lipase 44 11 - 51 U/L   Imaging Studies: US Abdomen Limited Ruq  Result Date: 02/27/2019 CLINICAL DATA:  Severe right upper quadrant pain since last night. EXAM: ULTRASOUND ABDOMEN LIMITED RIGHT UPPER QUADRANT COMPARISON:  CT scan February 20, 2019 FINDINGS: Gallbladder: Cholelithiasis is identified. In a mobile stone is seen in the neck of the gallbladder. Gallbladder wall thickening to 5 mm is noted. No Murphy's sign or pericholecystic fluid. Sludge is seen in the gallbladder. Common bile duct: Diameter: 3 mm Liver: Increased diffuse echogenicity with no focal mass. Portal vein is patent on color Doppler imaging with normal direction of blood flow towards the liver. IMPRESSION: 1. Cholelithiasis, gallbladder sludge, and wall thickening. A visualized stone in in the neck of the bladder is immobile. No Murphy's sign or pericholecystic fluid. If there is continued concern for acute cholecystitis, recommend a  HIDA scan. 2. Probable hepatic steatosis. Electronically Signed   By: Gerome Sam III M.D   On: 02/27/2019 09:24    ED COURSE and MDM  Nursing notes and initial vitals signs, including pulse oximetry, reviewed.  Vitals:   02/27/19 0600 02/27/19 0802 02/27/19 0900 02/27/19 0951  BP: 102/77 106/74 109/81 102/69  Pulse: 77 72 80 72  Resp:  Temp:      TempSrc:  SpO2: 100% 100% 100% 100%   2:52 AM Patient waiting for ultrasound which will not be available until 9 AM.  We are not permitted to have patient return as an outpatient due to COVID-19.  PROCEDURES    ED DIAGNOSES     ICD-10-CM   1. Calculus of gallbladder without cholecystitis without obstruction K80.20   2. RUQ abdominal pain R10.11 US Abdomen Limited RUQ    US Abdomen Limited RUQ       Cherish Runde, MD 02/27/19 1816

## 2019-02-27 NOTE — ED Notes (Signed)
Provided update for plan of care - pt agreeable to wait for ultrasound. Pt reports dizziness and headache.

## 2019-02-27 NOTE — ED Notes (Signed)
Patient transported to Ultrasound 

## 2019-02-27 NOTE — ED Triage Notes (Signed)
Triage completed using stratus interpreter. Pt continues to endorse R upper quadrant pain. Denies nausea and vomiting.

## 2019-02-27 NOTE — Discharge Instructions (Signed)
Avoid eating fatty or fried foods.  If you get another severe attack try taking 1 or 2 pain pills first to see if it helps but if not or if you start having vomiting and fever return to  [facility] or [name]

## 2019-02-27 NOTE — ED Provider Notes (Signed)
Assumed care at 7 AM patient awaiting ultrasound.  Patient currently is still pain-free and ultrasound shows cholelithiasis, gallbladder sludge and wall thickening.  There is a visualized stone in the neck of the gallbladder which is immobile.  There is no Murphy sign or pericholecystic fluid at this time.  Given patient is pain-free on repeat exam low suspicion for acute cholecystitis at this time.  Discussed with patient importance of follow-up with general surgery and given strict return precautions.  Patient given a prescription for pain medication to use as needed if she has another attack.   Gwyneth Sprout, MD 02/27/19 [phone]

## 2019-11-04 NOTE — L&D Delivery Note (Signed)
OB/GYN Faculty Practice Delivery Note  Shelly Reed is a 29 y.o. G3P1011 s/p VD at [redacted]w[redacted]d. She was admitted for IOL for gHTN.   ROM: Unknown; SROM occurred between 0045 and delivery GBS Status: --/NEGATIVE (03/15 1921) Maximum Maternal Temperature: 99.62F  Labor Progress: . Initial SVE: 0.5/thick/-3. Patient received a Cytotec and foley balloon and then quickly progressed to complete. SROM occurred at unknown time between 0045 and delivery.   Delivery Date/Time: 3/16 @ 0145 Delivery: Called to room and patient was complete and pushing. Head delivered in LOA position. Nuchal cord present and reduced after delivery. Shoulder and body delivered in usual fashion. Infant with spontaneous cry, placed on mother's abdomen, dried and stimulated. Cord clamped x 2 after 1-minute delay, and cut by RN Fails. Cord blood drawn. Placenta delivered spontaneously with gentle cord traction. True knot in cord also noted. Fundus firm with massage and Pitocin. Labia, perineum, vagina, and cervix inspected inspected with no lacerations.  Baby Weight: pending  Placenta: Sent to L&D Complications: None Lacerations: None EBL: 433 mL Analgesia: None   Infant:  APGAR (1 MIN): 9   APGAR (5 MINS): 9   APGAR (10 MINS):     Jerilynn Birkenhead, MD OB Family Medicine Fellow, [facility] for [facility], [facility] 01/17/2020, 2:06 AM

## 2020-01-16 ENCOUNTER — Encounter (HOSPITAL_COMMUNITY): Payer: Self-pay | Admitting: Obstetrics & Gynecology

## 2020-01-16 ENCOUNTER — Inpatient Hospital Stay (HOSPITAL_COMMUNITY)
Admission: AD | Admit: 2020-01-16 | Discharge: 2020-01-19 | DRG: 807 | Disposition: A | Discharge status: Home / Self Care | Payer: Medicaid Other | Attending: Obstetrics & Gynecology | Admitting: Obstetrics & Gynecology

## 2020-01-16 ENCOUNTER — Other Ambulatory Visit: Payer: Self-pay

## 2020-01-16 DIAGNOSIS — Z20822 Contact with and (suspected) exposure to covid-19: Secondary | ICD-10-CM | POA: Diagnosis present

## 2020-01-16 DIAGNOSIS — O133 Gestational [pregnancy-induced] hypertension without significant proteinuria, third trimester: Secondary | ICD-10-CM

## 2020-01-16 DIAGNOSIS — Z87891 Personal history of nicotine dependence: Secondary | ICD-10-CM

## 2020-01-16 DIAGNOSIS — Z3A34 34 weeks gestation of pregnancy: Secondary | ICD-10-CM

## 2020-01-16 DIAGNOSIS — O134 Gestational [pregnancy-induced] hypertension without significant proteinuria, complicating childbirth: Secondary | ICD-10-CM | POA: Diagnosis present

## 2020-01-16 DIAGNOSIS — Z3A37 37 weeks gestation of pregnancy: Secondary | ICD-10-CM

## 2020-01-16 HISTORY — DX: Gestational (pregnancy-induced) hypertension without significant proteinuria, third trimester: O13.3

## 2020-01-16 LAB — CBC
HCT: 36 % (ref 36.0–46.0)
Hemoglobin: 11.6 g/dL — ABNORMAL LOW (ref 12.0–15.0)
MCH: 26.4 pg (ref 26.0–34.0)
MCHC: 32.2 g/dL (ref 30.0–36.0)
MCV: 82 fL (ref 80.0–100.0)
Platelets: 186 10*3/uL (ref 150–400)
RBC: 4.39 MIL/uL (ref 3.87–5.11)
RDW: 20.3 % — ABNORMAL HIGH (ref 11.5–15.5)
WBC: 6.5 10*3/uL (ref 4.0–10.5)
nRBC: 0 % (ref 0.0–0.2)

## 2020-01-16 LAB — COMPREHENSIVE METABOLIC PANEL
ALT: 21 U/L (ref 0–44)
AST: 26 U/L (ref 15–41)
Albumin: 2.4 g/dL — ABNORMAL LOW (ref 3.5–5.0)
Alkaline Phosphatase: 242 U/L — ABNORMAL HIGH (ref 38–126)
Anion gap: 9 (ref 5–15)
BUN: 10 mg/dL (ref 6–20)
CO2: 21 mmol/L — ABNORMAL LOW (ref 22–32)
Calcium: 8.4 mg/dL — ABNORMAL LOW (ref 8.9–10.3)
Chloride: 109 mmol/L (ref 98–111)
Creatinine, Ser: 0.6 mg/dL (ref 0.44–1.00)
GFR calc Af Amer: >60 mL/min (ref >60)
GFR calc non Af Amer: >60 mL/min (ref >60)
Glucose, Bld: 90 mg/dL (ref 70–99)
Potassium: 4.1 mmol/L (ref 3.5–5.1)
Sodium: 139 mmol/L (ref 135–145)
Total Bilirubin: 0.7 mg/dL (ref 0.3–1.2)
Total Protein: 5.8 g/dL — ABNORMAL LOW (ref 6.5–8.1)

## 2020-01-16 LAB — PROTEIN / CREATININE RATIO, URINE
Creatinine, Urine: 168.72 mg/dL
Protein Creatinine Ratio: 0.97 mg/mg{Cre} — ABNORMAL HIGH (ref 0.00–0.15)
Total Protein, Urine: 164 mg/dL

## 2020-01-16 LAB — TYPE AND SCREEN
ABO/RH(D): O POS
Antibody Screen: NEGATIVE

## 2020-01-16 LAB — ABO/RH: ABO/RH(D): O POS

## 2020-01-16 LAB — GROUP B STREP BY PCR: Group B strep by PCR: NEGATIVE

## 2020-01-16 MED ORDER — LACTATED RINGERS IV SOLN: INTRAVENOUS | Status: DC

## 2020-01-16 MED ORDER — OXYTOCIN BOLUS FROM INFUSION: 500.0000 mL | Freq: Once | INTRAVENOUS | Status: DC

## 2020-01-16 MED ORDER — OXYCODONE-ACETAMINOPHEN 5-325 MG PO TABS: 2.0000 | ORAL_TABLET | ORAL | Status: DC | PRN

## 2020-01-16 MED ORDER — OXYTOCIN 40 UNITS IN NORMAL SALINE INFUSION - SIMPLE MED
2.5000 [IU]/h | INTRAVENOUS | Status: DC
  Filled 2020-01-16: qty 1000

## 2020-01-16 MED ORDER — ONDANSETRON HCL 4 MG/2ML IJ SOLN: 4.0000 mg | Freq: Four times a day (QID) | INTRAMUSCULAR | Status: DC | PRN

## 2020-01-16 MED ORDER — TERBUTALINE SULFATE 1 MG/ML IJ SOLN: 0.2500 mg | Freq: Once | INTRAMUSCULAR | Status: DC | PRN

## 2020-01-16 MED ORDER — FLEET ENEMA 7-19 GM/118ML RE ENEM: 1.0000 | ENEMA | RECTAL | Status: DC | PRN

## 2020-01-16 MED ORDER — SOD CITRATE-CITRIC ACID 500-334 MG/5ML PO SOLN: 30.0000 mL | ORAL | Status: DC | PRN

## 2020-01-16 MED ORDER — OXYCODONE-ACETAMINOPHEN 5-325 MG PO TABS: 1.0000 | ORAL_TABLET | ORAL | Status: DC | PRN

## 2020-01-16 MED ORDER — LIDOCAINE HCL (PF) 1 % IJ SOLN: 30.0000 mL | INTRAMUSCULAR | Status: DC | PRN

## 2020-01-16 MED ORDER — FENTANYL CITRATE (PF) 100 MCG/2ML IJ SOLN
100.0000 ug | INTRAMUSCULAR | Status: DC | PRN
  Administered 2020-01-16 – 2020-01-17 (×2): 100 ug via INTRAVENOUS
  Filled 2020-01-16: qty 2

## 2020-01-16 MED ORDER — FENTANYL CITRATE (PF) 100 MCG/2ML IJ SOLN
INTRAMUSCULAR | Status: AC
  Filled 2020-01-16: qty 2

## 2020-01-16 MED ORDER — LACTATED RINGERS IV SOLN: 500.0000 mL | INTRAVENOUS | Status: DC | PRN

## 2020-01-16 MED ORDER — MISOPROSTOL 50MCG HALF TABLET
50.0000 ug | ORAL_TABLET | ORAL | Status: DC | PRN
  Administered 2020-01-16: 50 ug via BUCCAL
  Filled 2020-01-16: qty 1

## 2020-01-16 MED ORDER — ACETAMINOPHEN 325 MG PO TABS
650.0000 mg | ORAL_TABLET | ORAL | Status: DC | PRN
  Administered 2020-01-17: 03:00:00 650 mg via ORAL
  Filled 2020-01-16: qty 2

## 2020-01-16 NOTE — Progress Notes (Signed)
Pt informed that the ultrasound is considered a limited OB ultrasound and is not intended to be a complete ultrasound exam.  Patient also informed that the ultrasound is not being completed with the intent of assessing for fetal or placental anomalies or any pelvic abnormalities.  Explained that the purpose of today's ultrasound is to assess for  presentation.  Patient acknowledges the purpose of the exam and the limitations of the study.    Cephalic  Shelly Soohoo, NP   

## 2020-01-16 NOTE — Progress Notes (Signed)
Labor Progress Note Dashanique Brownstein is a 29 y.o. G3P1011 at 10w3dpresented for gHTN. S: Met patient and discussed plan.   O:  BP (!) 142/92   Pulse 76   Temp 98.2 F (36.8 C) (Oral)   Resp 16   Ht 5' (1.524 m)   Wt 79.8 kg   SpO2 100%   BMI 34.35 kg/m  EFM: 140, moderate variability, pos accels, no decels, reactive TOCO: infrequent   CVE: Dilation: Fingertip Effacement (%): Thick Cervical Position: Posterior Station: -3 Presentation: Vertex Exam by:: Meriel Kelliher    A&P: 29y.o. [id number] for IOL for gHTN. #Labor: Vertex by exam and BSUS. Patient agreeable to foley balloon. Foley bulb placed with speculum and filled with 60 mL water; patient tolerated well. Will start Cytotec. AROM/Pit as indicated. Anticipate SVD. #Pain: per patient request #FWB: Cat I; 3300g #GBS PCR pending #gHTN: mild range pressures, neg Pre-E labs, serial BP's  ChChauncey MannMD 9:38 PM

## 2020-01-16 NOTE — H&P (Signed)
Obstetric History and Physical  *Spanish interpreter at bedside for this encounter*  Shelly Reed is a 29 y.o. G3P1011 with IUP at [redacted]w[redacted]d presenting for gestational hypertension. New onset hypertension in the office today. Denies history of hypertension. Had headache earlier today that resolved without medication. Does reports some flashes of light in her vision today. No epigastric pain. Elevated BPs continued in MAU. None severe range. Preeclampsia labs pending.   Prenatal Course Source of Care: [facility]  with onset of care at 12 weeks Pregnancy complications or risks:There are no problems to display for this patient.  She plans to breastfeed She desires oral progesterone-only contraceptive for postpartum contraception.   Prenatal labs and studies: ABO, Rh:  O positive Antibody:  negative Rubella:  immune RPR:   non reactive HBsAg:   non react ive HIV:   non reactive GBS:  PCR pending from MAU 1hr GTT:  100 Genetic screening not done Anatomy US normal  Prenatal Transfer Tool  Maternal Diabetes: No Genetic Screening: Declined Maternal Ultrasounds/Referrals: Normal Fetal Ultrasounds or other Referrals:  None Maternal Substance Abuse:  No Significant Maternal Medications:  None Significant Maternal Lab Results: None  Past Medical History:  Diagnosis Date  . Miscarriage within last 12 months   . Ovarian cyst     Past Surgical History:  Procedure Laterality Date  . OVARIAN CYST SURGERY      OB History  Gravida Para Term Preterm AB Living  3 1 1   1 1   SAB TAB Ectopic Multiple Live Births  1       1    # Outcome Date GA Lbr Len/2nd Weight Sex Delivery Anes PTL Lv  3 Current           2 Term 12/01/16    M Vag-Spont   LIV  1 SAB             Social History   Socioeconomic History  . Marital status: Married    Spouse name: Not on file  . Number of children: Not on file  . Years of education: Not on file  . Highest education level: Not on file   Occupational History  . Not on file  Tobacco Use  . Smoking status: Former Research scientist (life sciences)  . Smokeless tobacco: Never Used  Substance and Sexual Activity  . Alcohol use: Never  . Drug use: Never  . Sexual activity: Yes  Other Topics Concern  . Not on file  Social History Narrative  . Not on file   Social Determinants of Health   Financial Resource Strain:   . Difficulty of Paying Living Expenses:   Food Insecurity:   . Worried About Charity fundraiser in the Last Year:   . Arboriculturist in the Last Year:   Transportation Needs:   . Film/video editor (Medical):   Marland Kitchen Lack of Transportation (Non-Medical):   Physical Activity:   . Days of Exercise per Week:   . Minutes of Exercise per Session:   Stress:   . Feeling of Stress :   Social Connections:   . Frequency of Communication with Friends and Family:   . Frequency of Social Gatherings with Friends and Family:   . Attends Religious Services:   . Active Member of Clubs or Organizations:   . Attends Archivist Meetings:   Marland Kitchen Marital Status:     No family history on file.  Medications Prior to Admission  Medication Sig Dispense Refill Last Dose  .  Prenatal Vit-Fe Fumarate-FA (MULTIVITAMIN-PRENATAL) 27-0.8 MG TABS tablet Take 1 tablet by mouth daily at 12 noon.     Marland Kitchen acetaminophen (TYLENOL) 325 MG tablet Take 650 mg by mouth every 6 (six) hours as needed.    at no  . HYDROcodone-acetaminophen (NORCO/VICODIN) 5-325 MG tablet Take 1-2 tablets by mouth every 6 (six) hours as needed for severe pain. 15 tablet 0  at no  . ibuprofen (ADVIL) 600 MG tablet Take 600 mg by mouth every 6 (six) hours as needed.    at no  . sucralfate (CARAFATE) 1 GM/10ML suspension Take 10 mLs (1 g total) by mouth 4 (four) times daily -  with meals and at bedtime. 420 mL 0  at no    No Known Allergies  Review of Systems: Negative except for what is mentioned in HPI.  Physical Exam: BP (!) 135/92   Pulse 90   Temp 99.4 F (37.4 C)  (Oral)   Resp 18   Ht 5' (1.524 m)   Wt 79.8 kg   SpO2 100%   BMI 34.35 kg/m    Patient Vitals for the past 24 hrs:  BP Temp Temp src Pulse Resp SpO2 Height Weight  01/16/20 1844 (!) 135/92 -- -- 90 -- -- -- --  01/16/20 1827 (!) 139/91 -- -- 84 -- -- -- --  01/16/20 1803 140/85 99.4 F (37.4 C) Oral 85 18 100 % 5' (1.524 m) 79.8 kg    CONSTITUTIONAL: Well-developed, well-nourished female in no acute distress.  HENT:  Normocephalic, atraumatic, External right and left ear normal. Oropharynx is clear and moist EYES: Conjunctivae and EOM are normal. Pupils are equal, round, and reactive to light. No scleral icterus.  NECK: Normal range of motion, supple, no masses SKIN: Skin is warm and dry. No rash noted. Not diaphoretic. No erythema. No pallor. NEUROLOGIC: Alert and oriented to person, place, and time. Normal reflexes, muscle tone coordination. No cranial nerve deficit noted. PSYCHIATRIC: Normal mood and affect. Normal behavior. Normal judgment and thought content. CARDIOVASCULAR: Normal heart rate noted, regular rhythm RESPIRATORY: Effort and breath sounds normal, no problems with respiration noted ABDOMEN: Soft, nontender, nondistended, gravid. MUSCULOSKELETAL: Normal range of motion. No edema and no tenderness. 2+ distal pulses.  Cervical Exam:    Deferred for labor team Presentation: cephalic FHT:  NST:  Baseline: 145 bpm, Variability: Good {> 6 bpm), Accelerations: Reactive and Decelerations: Absent   Pertinent Labs/Studies:   No results found for this or any previous visit (from the past 24 hour(s)).  Assessment : Kaytlynn Kochan is a 29 y.o. G3P1011 at [redacted]w[redacted]d being admitted for induction of labor due to gestational hypertension.  Plan: Labor: Induction as ordered as per protocol.  Analgesia as needed. FWB: Reassuring fetal heart tracing.   GBS PCR pending Delivery plan: Hopeful for vaginal delivery   Judeth Horn, NP

## 2020-01-16 NOTE — MAU Note (Signed)
Sent from HD with elevated BP.  Had a HA, is gone now.  Seeing flashes of light, denies epigastric pain, reports swelling in hands and feet.

## 2020-01-17 ENCOUNTER — Encounter (HOSPITAL_COMMUNITY): Payer: Self-pay | Admitting: Obstetrics & Gynecology

## 2020-01-17 DIAGNOSIS — O133 Gestational [pregnancy-induced] hypertension without significant proteinuria, third trimester: Secondary | ICD-10-CM

## 2020-01-17 DIAGNOSIS — Z3A37 37 weeks gestation of pregnancy: Secondary | ICD-10-CM

## 2020-01-17 LAB — RPR: RPR Ser Ql: NONREACTIVE

## 2020-01-17 LAB — SARS CORONAVIRUS 2 (TAT 6-24 HRS): SARS Coronavirus 2: NEGATIVE

## 2020-01-17 MED ORDER — IBUPROFEN 600 MG PO TABS
600.0000 mg | ORAL_TABLET | Freq: Three times a day (TID) | ORAL | Status: DC | PRN
  Administered 2020-01-17 – 2020-01-19 (×6): 600 mg via ORAL
  Filled 2020-01-17 (×6): qty 1

## 2020-01-17 MED ORDER — ACETAMINOPHEN 325 MG PO TABS
650.0000 mg | ORAL_TABLET | Freq: Four times a day (QID) | ORAL | Status: DC | PRN
  Administered 2020-01-17 – 2020-01-19 (×3): 650 mg via ORAL
  Filled 2020-01-17 (×4): qty 2

## 2020-01-17 MED ORDER — MEASLES, MUMPS & RUBELLA VAC IJ SOLR: 0.5000 mL | Freq: Once | INTRAMUSCULAR | Status: DC

## 2020-01-17 MED ORDER — LACTATED RINGERS IV SOLN
500.0000 mL | Freq: Once | INTRAVENOUS | Status: AC
  Administered 2020-01-17: 500 mL via INTRAVENOUS

## 2020-01-17 MED ORDER — PRENATAL MULTIVITAMIN CH
1.0000 | ORAL_TABLET | Freq: Every day | ORAL | Status: DC
  Administered 2020-01-17 – 2020-01-18 (×2): 1 via ORAL
  Filled 2020-01-17 (×2): qty 1

## 2020-01-17 MED ORDER — DIPHENHYDRAMINE HCL 50 MG/ML IJ SOLN: 12.5000 mg | INTRAMUSCULAR | Status: DC | PRN

## 2020-01-17 MED ORDER — PHENYLEPHRINE 40 MCG/ML (10ML) SYRINGE FOR IV PUSH (FOR BLOOD PRESSURE SUPPORT): 80.0000 ug | PREFILLED_SYRINGE | INTRAVENOUS | Status: DC | PRN

## 2020-01-17 MED ORDER — DIBUCAINE (PERIANAL) 1 % EX OINT: 1.0000 "application " | TOPICAL_OINTMENT | CUTANEOUS | Status: DC | PRN

## 2020-01-17 MED ORDER — SIMETHICONE 80 MG PO CHEW: 80.0000 mg | CHEWABLE_TABLET | ORAL | Status: DC | PRN

## 2020-01-17 MED ORDER — BENZOCAINE-MENTHOL 20-0.5 % EX AERO
1.0000 "application " | INHALATION_SPRAY | CUTANEOUS | Status: DC | PRN
  Administered 2020-01-17: 1 via TOPICAL
  Filled 2020-01-17: qty 56

## 2020-01-17 MED ORDER — ONDANSETRON HCL 4 MG PO TABS: 4.0000 mg | ORAL_TABLET | ORAL | Status: DC | PRN

## 2020-01-17 MED ORDER — COCONUT OIL OIL: 1.0000 "application " | TOPICAL_OIL | Status: DC | PRN

## 2020-01-17 MED ORDER — FENTANYL-BUPIVACAINE-NACL 0.5-0.125-0.9 MG/250ML-% EP SOLN: 12.0000 mL/h | EPIDURAL | Status: DC | PRN

## 2020-01-17 MED ORDER — FENTANYL-BUPIVACAINE-NACL 0.5-0.125-0.9 MG/250ML-% EP SOLN
EPIDURAL | Status: AC
  Filled 2020-01-17: qty 250

## 2020-01-17 MED ORDER — SENNOSIDES-DOCUSATE SODIUM 8.6-50 MG PO TABS
2.0000 | ORAL_TABLET | ORAL | Status: DC
  Administered 2020-01-17 – 2020-01-18 (×2): 2 via ORAL
  Filled 2020-01-17 (×2): qty 2

## 2020-01-17 MED ORDER — EPHEDRINE 5 MG/ML INJ: 10.0000 mg | INTRAVENOUS | Status: DC | PRN

## 2020-01-17 MED ORDER — TETANUS-DIPHTH-ACELL PERTUSSIS 5-2.5-18.5 LF-MCG/0.5 IM SUSP: 0.5000 mL | Freq: Once | INTRAMUSCULAR | Status: DC

## 2020-01-17 MED ORDER — ONDANSETRON HCL 4 MG/2ML IJ SOLN: 4.0000 mg | INTRAMUSCULAR | Status: DC | PRN

## 2020-01-17 MED ORDER — OXYTOCIN 10 UNIT/ML IJ SOLN
INTRAMUSCULAR | Status: AC
  Administered 2020-01-17: 10 [IU]
  Filled 2020-01-17: qty 1

## 2020-01-17 MED ORDER — WITCH HAZEL-GLYCERIN EX PADS: 1.0000 "application " | MEDICATED_PAD | CUTANEOUS | Status: DC | PRN

## 2020-01-17 MED ORDER — DIPHENHYDRAMINE HCL 25 MG PO CAPS: 25.0000 mg | ORAL_CAPSULE | Freq: Four times a day (QID) | ORAL | Status: DC | PRN

## 2020-01-17 NOTE — Lactation Note (Signed)
This note was copied from a baby's chart. Lactation Consultation Note  Patient Name: Shelly Reed [id number] Date: 01/17/2020 Reason for consult: Initial assessment P2, 5 hour ETI female infant. Spanish interpreter used Marquita Palms [phone] Per mom, infant did not sustain latch in L&D maybe 2 minutes. Mom is on the [facility] program in [facility] but she doesn't have breast pump at home. Mom's current feeding choice is breast and formula feeding. This is mom's 2nd attempt at latching infant at breast. Tools given: hand pump to help evert nipple shaft out more prior to latching infant at breast mom has pseudo invert nipples that responds well to stimulation. Per mom, she attempted to breastfeed her 78 year old but stopped after two days, infant would never latch at breast so she only bottle fed infant formula. Mom taught back hand expression and infant was given 3 mls of colostrum by spoon. Mom pre-pumped breast then  latched infant on right breast using the cross cradle hold, infant was on and off breast in beginning but improved towards end of feeding, infant breastfed for 10 minutes. Mom shown how to use hand pump & how to disassemble, clean, & reassemble parts. Mom knows to breastfeed infant according to hunger cues,  8 to 12 times within 24 hours and on demand.   Mom knows to ask for assistance with latching infant at breast if needed from RN or [facility] services. Mom will continue to do as much STS as possible. Reviewed Baby & Me book's Breastfeeding Basics.  Mom made aware of O/P services, breastfeeding support groups, community resources, and our phone # for post-discharge questions.   Maternal Data Formula Feeding for Exclusion: Yes Reason for exclusion: Mother's choice to formula and breast feed on admission Has patient been taught Hand Expression?: Yes Does the patient have breastfeeding experience prior to this delivery?: Yes  Feeding Feeding Type: Breast Fed  LATCH  Score Latch: Repeated attempts needed to sustain latch, nipple held in mouth throughout feeding, stimulation needed to elicit sucking reflex.  Audible Swallowing: Spontaneous and intermittent  Type of Nipple: Inverted  Comfort (Breast/Nipple): Soft / non-tender  Hold (Positioning): Assistance needed to correctly position infant at breast and maintain latch.  LATCH Score: 6  Interventions Interventions: Breast feeding basics reviewed;Breast compression;Adjust position;Assisted with latch;Hand pump;Support pillows;Skin to skin;Breast massage;Position options;Expressed milk;Hand express;Pre-pump if needed  Lactation Tools Discussed/Used Tools: Pump Breast pump type: Manual WIC Program: Yes Pump Review: Setup, frequency, and cleaning;Milk Storage Initiated by:: Danelle Earthly, IBCLC Date initiated:: 01/17/20   Consult Status Consult Status: Follow-up Date: 01/17/20 Follow-up type: In-patient    Danelle Earthly 01/17/2020, 7:14 AM

## 2020-01-17 NOTE — Discharge Summary (Addendum)
Spanish interpreter [phone] used for this encounter    Postpartum Discharge Summary     Patient Name: Shelly Reed DOB: [birth year] MRN: [id number]  Date of admission: 01/16/2020 Delivering Provider: Chauncey Mann   Date of discharge: 01/19/2020  Admitting diagnosis: Gestational hypertension, third trimester [O13.3] Intrauterine pregnancy: [redacted]w[redacted]d    Secondary diagnosis:  Active Problems:   Gestational hypertension, third trimester   [redacted] weeks gestation of pregnancy  Additional problems: None     Discharge diagnosis: Term Pregnancy Delivered and Gestational Hypertension                                                                                                Post partum procedures: None  Augmentation: Cytotec and Foley Balloon  Complications: None  Hospital course:  Induction of Labor With Vaginal Delivery   29y.o. yo G3P1011 at 358w4das admitted to the hospital 01/16/2020 for induction of labor.  Indication for induction: Gestational hypertension.  Patient had an uncomplicated labor course as follows: Initial SVE: 0.5/thick/-3. Patient received a Cytotec and foley balloon and then quickly progressed to complete. SROM occurred at unknown time between 0045 and delivery.  Membrane Rupture Time/Date: 1:23 AM ,01/17/2020   Intrapartum Procedures: Episiotomy: None [1]                                         Lacerations:  None [1]  Patient had delivery of a Viable infant.  Information for the patient's newborn:  BaSereena, Marando0[[id number]]Delivery Method: Vag-Spont    01/17/2020  Details of delivery can be found in separate delivery note.  Patient had a routine postpartum course. BP's monitored and WNL. Desires POPs for contraception. Patient is discharged home 01/19/20. Delivery time: 1:45 AM    Magnesium Sulfate received: No BMZ received: No Rhophylac:No MMR:No Transfusion:No  Physical exam  Vitals:   01/18/20 0500 01/18/20 1537 01/18/20 2320  01/19/20 0615  BP: 123/84 125/87 116/78 123/82  Pulse: 74 93 85 87  Resp: '20 19 16 15  '$ Temp: 97.9 F (36.6 C) 99.2 F (37.3 C) 98.5 F (36.9 C) 98.2 F (36.8 C)  TempSrc: Oral Oral Oral Oral  SpO2: 100% 99% 99% 100%  Weight:      Height:       General: alert, cooperative and no distress Lochia: appropriate Uterine Fundus: firm Incision: N/A DVT Evaluation: No evidence of DVT seen on physical exam. No cords or calf tenderness. No significant calf/ankle edema. Labs: Lab Results  Component Value Date   WBC 6.5 01/16/2020   HGB 11.6 (L) 01/16/2020   HCT 36.0 01/16/2020   MCV 82.0 01/16/2020   PLT 186 01/16/2020   CMP Latest Ref Rng & Units 01/16/2020  Glucose 70 - 99 mg/dL 90  BUN 6 - 20 mg/dL 10  Creatinine 0.44 - 1.00 mg/dL 0.60  Sodium 135 - 145 mmol/L 139  Potassium 3.5 - 5.1 mmol/L 4.1  Chloride 98 - 111 mmol/L 109  CO2 22 - 32  mmol/L 21(L)  Calcium 8.9 - 10.3 mg/dL 8.4(L)  Total Protein 6.5 - 8.1 g/dL 5.8(L)  Total Bilirubin 0.3 - 1.2 mg/dL 0.7  Alkaline Phos 38 - 126 U/L 242(H)  AST 15 - 41 U/L 26  ALT 0 - 44 U/L 21   Edinburgh Score: Edinburgh Postnatal Depression Scale Screening Tool 01/18/2020  I have been able to laugh and see the funny side of things. 0  I have looked forward with enjoyment to things. 0  I have blamed myself unnecessarily when things went wrong. 0  I have been anxious or worried for no good reason. 0  I have felt scared or panicky for no good reason. 2  Things have been getting on top of me. 1  I have been so unhappy that I have had difficulty sleeping. 0  I have felt sad or miserable. 0  I have been so unhappy that I have been crying. 0  The thought of harming myself has occurred to me. 0  Edinburgh Postnatal Depression Scale Total 3    Discharge instruction: per After Visit Summary and "Baby and Me Booklet".  After visit meds:  Allergies as of 01/19/2020   No Known Allergies      Medication List     TAKE these medications     acetaminophen 325 MG tablet Commonly known as: TYLENOL Take 650 mg by mouth every 6 (six) hours as needed.   ibuprofen 600 MG tablet Commonly known as: ADVIL Take 1 tablet (600 mg total) by mouth every 8 (eight) hours as needed for mild pain.   norethindrone 0.35 MG tablet Commonly known as: Ortho Micronor Take 1 tablet (0.35 mg total) by mouth daily.   prenatal multivitamin Tabs tablet Take 1 tablet by mouth daily at 12 noon.        Diet: routine diet  Activity: Advance as tolerated. Pelvic rest for 6 weeks.   Outpatient follow up:4 weeks Follow up Appt:No future appointments. Follow up Visit:   Patient to f/u with HD. BP check requested at [facility] in 1 week.    Newborn Data: Live born female  Birth Weight: 2795g  APGAR: 55, 9  Newborn Delivery   Birth date/time: 01/17/2020 01:45:00 Delivery type: Vaginal, Spontaneous      Baby Feeding: Breast Disposition:home with mother   01/19/2020 Paulla Dolly, MD  GME ATTESTATION:  I saw and evaluated the patient. I agree with the findings and the plan of care as documented in the resident's note.  Merilyn Baba, DO OB Fellow, [place] for [place] 01/19/2020 7:51 AM

## 2020-01-18 NOTE — Progress Notes (Signed)
Post Partum Day 1 Subjective: Patient reports feeling well. She is tolerating PO. Ambulating and urinating without difficulty. Lochia minimal.  Objective: Blood pressure 123/84, pulse 74, temperature 97.9 F (36.6 C), temperature source Oral, resp. rate 20, height 5' (1.524 m), weight 79.8 kg, SpO2 100 %, unknown if currently breastfeeding.  Physical Exam:  General: alert, cooperative and appears stated age Lochia: appropriate Uterine Fundus: firm Incision: NA DVT Evaluation: No evidence of DVT seen on physical exam.  Recent Labs    01/16/20 1855  HGB 11.6*  HCT 36.0    Assessment/Plan: Plan for discharge tomorrow. Okay to discharge today if baby can; RN to page team for orders. Mom mentioned Peds is monitoring bilirubin so not likely.  Vitals stable. Patient with gHTN. BP's normal range and will not start BP med now. CTM while inpatient. Mom to f/u at HD in 1 week for BP check and was informed of this. Breastfeeding POPs on discharge   LOS: 2 days   Shelly Reed 01/18/2020, 5:53 AM

## 2020-01-18 NOTE — Progress Notes (Signed)
Ordered pt meals, by Viria Alvarez Spanish Interpreter. 

## 2020-01-18 NOTE — Lactation Note (Signed)
This note was copied from a baby's chart. Lactation Consultation Note  Patient Name: Shelly Reed LOVFI'E Date: 01/18/2020 Reason for consult: Follow-up assessment;Early term 4-38.6wks  P2 mother whose infant is now 53 hours old.  This is an ETI at 37+4 weeks.  Mother attempted to breast feed her first child (now 27 years old) but stopped at 2 days due to difficulty with latching.  Mother's feeding preference is breast/bottle.  Spanish interpreter, [phone], used for interpretation.  Baby was awake and fussy when I arrived.  He last fed three hours ago and consumed 40 mls of [employer] without difficulty.  Mother has only breast fed a few times and has not been using the DEBP for stimulation.  Offered to assist with latching and mother agreeable.  Mother's breasts are soft and non tender and nipples are short shafted, compressible and intact.  No colostrum drops noted at this time.  Assisted to latch in the cross cradle position to the right breast with some difficulty.  Baby would latch, suck a few times, and become agitated.  I helped him latch repeatedly before he sustained his latch and sucked intermittently for approximately 10 minutes.  Mother felt a gentle tug and denied pain with latching.  He continued to become agitated easily and this may be due to the many times he has had an arificial nipple with a quick flow of milk to satisfy him.  He needs much encouragement to latch and sustain his latch.  Demonstrated burping and latched again, a little bit easier after burping.  However, he still shows little interest in wanting to work at breast feeding.  Explained to mother how she can help by putting him to the breast every time he needs to feed.  Stressed that she does not want to wait until he becomes too hungry because it is very difficulty to latch a baby that is stressed.  Mother verbalized understanding.  After 10 minutes of on/off sucking I swaddled him and gave him to  his father.  Mother willing to begin pumping with the DEBP.  Much education provided regarding pumping, pump assembly, disassembly and cleaning.  #24 flanges are appropriate at this time.  Observed mother pumping for 15 minutes while discussing breast feeding basics.  Mother will benefit by continued reinforcement of pumping and feeding plan.  No colostrum drops noted at the end of the 15 minutes.  Mother asked for pain medication due to uterine cramps and RN notified.  Asked mother to continue observing for feeding cues and to ask for latch assistance as needed.  She will supplement with Shelly Reed and post pump for 15 minutes after every feeding.    Mother is a [facility] participant in [facility] but does not have a DEBP for home use.  Offered to send a [facility] referral and mother appreciative.  She has a manual pump at bedside.  Father present.  RN updated.   Maternal Data Formula Feeding for Exclusion: Yes Reason for exclusion: Mother's choice to formula feed on admision Has patient been taught Hand Expression?: Yes Does the patient have breastfeeding experience prior to this delivery?: No(Tried in the hospital and then discontinued due to baby not latching)  Feeding Feeding Type: Breast Fed Nipple Type: Slow - flow  LATCH Score Latch: Repeated attempts needed to sustain latch, nipple held in mouth throughout feeding, stimulation needed to elicit sucking reflex.  Audible Swallowing: None  Type of Nipple: Everted at rest and after stimulation  Comfort (  Breast/Nipple): Soft / non-tender  Hold (Positioning): Assistance needed to correctly position infant at breast and maintain latch.  LATCH Score: 6  Interventions Interventions: Breast feeding basics reviewed;Assisted with latch;Skin to skin;Hand express;Breast compression;Adjust position;DEBP;Hand pump;Position options;Support pillows  Lactation Tools Discussed/Used Tools: Pump Breast pump type: Double-Electric Breast Pump WIC  Program: Yes Pump Review: Setup, frequency, and cleaning;Milk Storage Initiated by:: Nachman Sundt Date initiated:: 01/18/20   Consult Status Consult Status: Follow-up Date: 01/19/20 Follow-up type: In-patient    Shelly Reed 01/18/2020, 5:11 PM
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
## 2020-01-19 MED ORDER — IBUPROFEN 600 MG PO TABS: 600.0000 mg | ORAL_TABLET | Freq: Three times a day (TID) | ORAL | 0 refills | Status: DC | PRN

## 2020-01-19 MED ORDER — NORETHINDRONE 0.35 MG PO TABS: 1.0000 | ORAL_TABLET | Freq: Every day | ORAL | 11 refills | Status: DC

## 2020-01-19 NOTE — Discharge Instructions (Signed)
Parto vaginal, cuidados de puerperio Postpartum Care After Vaginal Delivery Lea esta informacin sobre cmo cuidarse desde el momento en que nazca su beb y hasta 6 a 12 semanas despus del parto (perodo del posparto). El mdico tambin podr darle instrucciones ms especficas. Comunquese con su mdico si tiene problemas o preguntas. Siga estas indicaciones en su casa: Hemorragia vaginal  Es normal tener un poco de hemorragia vaginal (loquios) despus del parto. Use un apsito sanitario para el sangrado vaginal y secrecin. ? Durante la primera semana despus del parto, la cantidad y el aspecto de los loquios a menudo es similar a las del perodo menstrual. ? Durante las siguientes semanas disminuir gradualmente hasta convertirse en una secrecin seca amarronada o amarillenta. ? En la mayora de las mujeres, los loquios se detienen completamente entre 4 a 6semanas despus del parto. Los sangrados vaginales pueden variar de mujer a mujer.  Cambie los apsitos sanitarios con frecuencia. Observe si hay cambios en el flujo, como: ? Un aumento repentino en el volumen. ? Cambio en el color. ? Cogulos sanguneos grandes.  Si expulsa un cogulo de sangre por la vagina, gurdelo y llame al mdico para informrselo. No deseche los cogulos de sangre por el inodoro antes de hablar con su mdico.  No use tampones ni se haga duchas vaginales hasta que el mdico la autorice.  Si no est amamantando, volver a tener su perodo entre 6 y 8 semanas despus del parto. Si solamente alimenta al beb con leche materna (lactancia materna exclusiva), podra no volver a tener su perodo hasta que deje de amamantar. Cuidados perineales  Mantenga la zona entre la vagina y el ano (perineo) limpia y seca, como se lo haya indicado el mdico. Utilice apsitos o aerosoles analgsicos y cremas, como se lo hayan indicado.  Si le hicieron un corte en el perineo (episiotoma) o tuvo un desgarro en la vagina, controle la  zona para detectar signos de infeccin hasta que sane. Est atenta a los siguientes signos: ? Aumento del enrojecimiento, la hinchazn o el dolor. ? Presenta lquido o sangre que supura del corte o desgarro. ? Calor. ? Pus o mal olor.  Es posible que le den una botella rociadora para que use en lugar de limpiarse el rea con papel higinico despus de usar el bao. Cuando comience a sanar, podr usar la botella rociadora antes de secarse. Asegrese de secarse suavemente.  Para aliviar el dolor causado por una episiotoma, un desgarro en la vagina o venas hinchadas en el ano (hemorroides), trate de tomar un bao de asiento tibio 2 o 3 veces por da. Un bao de asiento es un bao de agua tibia que se toma mientras se est sentado. El agua solo debe llegar hasta las caderas y cubrir las nalgas. Cuidado de las mamas  En los primeros das despus del parto, las mamas pueden sentirse pesadas, llenas e incmodas (congestin mamaria). Tambin puede escaparse leche de sus senos. El mdico puede sugerirle mtodos para aliviar este malestar. La congestin mamaria debera desaparecer al cabo de unos das.  Si est amamantando: ? Use un sostn que sujete y ajuste bien sus pechos. ? Mantenga los pezones secos y limpios. Aplquese cremas y ungentos, como se lo haya indicado el mdico. ? Es posible que deba usar discos de algodn en el sostn para absorber la leche que se filtre de sus senos. ? Puede tener contracciones uterinas cada vez que amamante durante varias semanas despus del parto. Las contracciones uterinas ayudan al tero a   regresar a su tamao habitual. ? Si tiene algn problema con la lactancia materna, colabore con el mdico o un asesor en lactancia.  Si no est amamantando: ? Evite tocarse mucho las mamas. Al hacerlo, podran producir ms leche. ? Use un sostn que le proporcione el ajuste correcto y compresas fras para reducir la hinchazn. ? No extraiga (saque) leche materna. Esto har que  produzca ms leche. Intimidad y sexualidad  Pregntele al mdico cundo puede retomar la actividad sexual. Esto puede depender de lo siguiente: ? Su riesgo de sufrir infecciones. ? La rapidez con la que est sanando. ? Su comodidad y deseo de retomar la actividad sexual.  Despus del parto, puede quedar embarazada incluso si no ha tenido todava su perodo. Si lo desea, hable con el mdico acerca de los mtodos de control de la natalidad (mtodos anticonceptivos). Medicamentos  Tome los medicamentos de venta libre y los recetados solamente como se lo haya indicado el mdico.  Si le recetaron un antibitico, tmelo como se lo haya indicado el mdico. No deje de tomar el antibitico aunque comience a sentirse mejor. Actividad  Retome sus actividades normales de a poco como se lo haya indicado el mdico. Pregntele al mdico qu actividades son seguras para usted.  Descanse todo lo que pueda. Trate de descansar o tomar una siesta mientras el beb duerme. Comida y bebida   Beba suficiente lquido como para mantener la orina de color amarillo plido.  Coma alimentos ricos en fibras todos los das. Estos pueden ayudarla a prevenir o aliviar el estreimiento. Los alimentos ricos en fibras incluyen, entre otros: ? Panes y cereales integrales. ? Arroz integral. ? Frijoles. ? Frutas y verduras frescas.  No intente perder de peso rpidamente reduciendo el consumo de caloras.  Tome sus vitaminas prenatales hasta la visita de seguimiento de posparto o hasta que su mdico le indique que puede dejar de tomarlas. Estilo de vida  No consuma ningn producto que contenga nicotina o tabaco, como cigarrillos y cigarrillos electrnicos. Si necesita ayuda para dejar de fumar, consulte al mdico.  No beba alcohol, especialmente si est amamantando. Instrucciones generales  Concurra a todas las visitas de seguimiento para usted y el beb, como se lo haya indicado el mdico. La mayora de las mujeres  visita al mdico para un seguimiento de posparto dentro de las primeras 3 a 6 semanas despus del parto. Comunquese con un mdico si:  Se siente incapaz de controlar los cambios que implica tener un hijo y esos sentimientos no desaparecen.  Siente tristeza o preocupacin de forma inusual.  Las mamas se ponen rojas, le duelen o se endurecen.  Tiene fiebre.  Tiene dificultad para retener la orina o para impedir que la orina se escape.  Tiene poco inters o falta de inters en actividades que solan gustarle.  No ha amamantado nada y no ha tenido un perodo menstrual durante 12 semanas despus del parto.  Dej de amamantar al beb y no ha tenido su perodo menstrual durante 12 semanas despus de dejar de amamantar.  Tiene preguntas sobre su cuidado y el del beb.  Elimina un cogulo de sangre grande por la vagina. S[place]ite ayuda de inmediato si:  Siente dolor en el pecho.  Tiene dificultad para respirar.  Tiene un dolor repentino e intenso en la pierna.  Tiene dolor intenso o clicos en el la parte inferior del abdomen.  Tiene una hemorragia tan intensa de la vagina que empapa ms de un apsito en una hora. El sangrado   no debe ser ms abundante que el perodo ms intenso que haya tenido.  Dolor de cabeza intenso.  Se desmaya.  Tiene visin borrosa o manchas en la vista.  Tiene secrecin vaginal con mal olor.  Tiene pensamientos acerca de lastimarse a usted misma o a su beb. Si alguna vez siente que puede lastimarse a usted misma o a otras personas, o tiene pensamientos de poner fin a su vida, busque ayuda de inmediato. Puede dirigirse al departamento de emergencias ms cercano o llamar a:  El servicio de emergencias de su localidad (911 en [place]  Una lnea de asistencia al suicida y atencin en crisis, como la Lnea Nacional de Prevencin del Suicidio (National Suicide Prevention Lifeline), al 1-800-273-8255. Est disponible las 24 horas del da. Resumen  El  perodo de tiempo justo despus el parto y hasta 6 a 12 semanas despus del parto se denomina perodo posparto.  Retome sus actividades normales de a poco como se lo haya indicado el mdico.  Concurra a todas las visitas de seguimiento para usted y el beb, como se lo haya indicado el mdico. Esta informacin no tiene como fin reemplazar el consejo del mdico. Asegrese de hacerle al mdico cualquier pregunta que tenga. Document Revised: 01/30/2018 Document Reviewed: 10/11/2017 Elsevier Patient Education  2020 Elsevier Inc.  

## 2020-01-19 NOTE — Lactation Note (Signed)
This note was copied from a baby's chart. Lactation Consultation Note  Patient Name: Shelly Reed BJYNW'[id number] Date: 01/19/2020   P2, Baby 57 hours old.  Spanish interpreter present. Mother states she is worried she does not have enough breastmilk to feed her baby. Reviewed hand expression and milk projected out of breast with strong flow. Mother was surprised.  Praised her and encouraged her to continue to bf before offering formula. Discussed supply and demand and bf before offering formula. Feed on demand with cues.  Goal 8-12+ times per day after first 24 hrs.  Place baby STS if not cueing.  Reviewed engorgement care and monitoring voids/stools.      Maternal Data    Feeding Feeding Type: Bottle Fed - Formula Nipple Type: Slow - flow  LATCH Score                   Interventions    Lactation Tools Discussed/Used     Consult Status      Hardie Pulley 01/19/2020, 10:55 AM

## 2020-02-27 ENCOUNTER — Emergency Department (HOSPITAL_COMMUNITY)
Admission: EM | Admit: 2020-02-27 | Discharge: 2020-02-27 | Discharge status: Against Medical Advice | Payer: Medicaid Other | Attending: Emergency Medicine | Admitting: Emergency Medicine

## 2020-02-27 ENCOUNTER — Other Ambulatory Visit: Payer: Self-pay

## 2020-02-27 DIAGNOSIS — R109 Unspecified abdominal pain: Secondary | ICD-10-CM | POA: Insufficient documentation

## 2020-02-27 DIAGNOSIS — Z5321 Procedure and treatment not carried out due to patient leaving prior to being seen by health care provider: Secondary | ICD-10-CM | POA: Insufficient documentation

## 2020-02-27 LAB — COMPREHENSIVE METABOLIC PANEL
ALT: 60 U/L — ABNORMAL HIGH (ref 0–44)
AST: 38 U/L (ref 15–41)
Albumin: 3.7 g/dL (ref 3.5–5.0)
Alkaline Phosphatase: 128 U/L — ABNORMAL HIGH (ref 38–126)
Anion gap: 10 (ref 5–15)
BUN: 20 mg/dL (ref 6–20)
CO2: 24 mmol/L (ref 22–32)
Calcium: 9.3 mg/dL (ref 8.9–10.3)
Chloride: 104 mmol/L (ref 98–111)
Creatinine, Ser: 0.7 mg/dL (ref 0.44–1.00)
GFR calc Af Amer: >60 mL/min (ref >60)
GFR calc non Af Amer: >60 mL/min (ref >60)
Glucose, Bld: 109 mg/dL — ABNORMAL HIGH (ref 70–99)
Potassium: 3.7 mmol/L (ref 3.5–5.1)
Sodium: 138 mmol/L (ref 135–145)
Total Bilirubin: 0.7 mg/dL (ref 0.3–1.2)
Total Protein: 6.7 g/dL (ref 6.5–8.1)

## 2020-02-27 LAB — CBC
HCT: 41.2 % (ref 36.0–46.0)
Hemoglobin: 13.3 g/dL (ref 12.0–15.0)
MCH: 27.1 pg (ref 26.0–34.0)
MCHC: 32.3 g/dL (ref 30.0–36.0)
MCV: 83.9 fL (ref 80.0–100.0)
Platelets: 298 10*3/uL (ref 150–400)
RBC: 4.91 MIL/uL (ref 3.87–5.11)
RDW: 16.3 % — ABNORMAL HIGH (ref 11.5–15.5)
WBC: 9.2 10*3/uL (ref 4.0–10.5)
nRBC: 0 % (ref 0.0–0.2)

## 2020-02-27 LAB — I-STAT BETA HCG BLOOD, ED (MC, WL, AP ONLY): I-stat hCG, quantitative: <5 m[IU]/mL (ref <5)

## 2020-02-27 LAB — LIPASE, BLOOD: Lipase: 47 U/L (ref 11–51)

## 2020-02-27 NOTE — ED Notes (Signed)
Pt did not want to wait.  

## 2020-02-27 NOTE — ED Notes (Signed)
Pt found sitting outside, states she is leaving.

## 2020-02-27 NOTE — ED Triage Notes (Signed)
Pt has hx of gallstones and states that she currently having pain that is so unbearable. Pt is in tears. States pain has been ongoing for a few days, but the pain worsened last night after eating tacos and/or soda.

## 2021-01-12 ENCOUNTER — Encounter (HOSPITAL_COMMUNITY): Payer: Self-pay | Admitting: Emergency Medicine

## 2021-01-12 ENCOUNTER — Other Ambulatory Visit: Payer: Self-pay

## 2021-01-12 ENCOUNTER — Inpatient Hospital Stay (HOSPITAL_COMMUNITY): Payer: Self-pay

## 2021-01-12 ENCOUNTER — Inpatient Hospital Stay (HOSPITAL_COMMUNITY)
Admission: EM | Admit: 2021-01-12 | Discharge: 2021-01-12 | Disposition: A | Discharge status: Home / Self Care | Payer: Self-pay | Attending: Family Medicine | Admitting: Family Medicine

## 2021-01-12 DIAGNOSIS — O418X10 Other specified disorders of amniotic fluid and membranes, first trimester, not applicable or unspecified: Secondary | ICD-10-CM

## 2021-01-12 DIAGNOSIS — O208 Other hemorrhage in early pregnancy: Secondary | ICD-10-CM | POA: Insufficient documentation

## 2021-01-12 DIAGNOSIS — O468X1 Other antepartum hemorrhage, first trimester: Secondary | ICD-10-CM

## 2021-01-12 DIAGNOSIS — O209 Hemorrhage in early pregnancy, unspecified: Secondary | ICD-10-CM

## 2021-01-12 DIAGNOSIS — Z3491 Encounter for supervision of normal pregnancy, unspecified, first trimester: Secondary | ICD-10-CM

## 2021-01-12 DIAGNOSIS — Z3A01 Less than 8 weeks gestation of pregnancy: Secondary | ICD-10-CM | POA: Insufficient documentation

## 2021-01-12 HISTORY — DX: Anemia, unspecified: D64.9

## 2021-01-12 LAB — CBC
HCT: 39.7 % (ref 36.0–46.0)
Hemoglobin: 13.4 g/dL (ref 12.0–15.0)
MCH: 28.6 pg (ref 26.0–34.0)
MCHC: 33.8 g/dL (ref 30.0–36.0)
MCV: 84.8 fL (ref 80.0–100.0)
Platelets: 230 10*3/uL (ref 150–400)
RBC: 4.68 MIL/uL (ref 3.87–5.11)
RDW: 15.7 % — ABNORMAL HIGH (ref 11.5–15.5)
WBC: 8.2 10*3/uL (ref 4.0–10.5)
nRBC: 0 % (ref 0.0–0.2)

## 2021-01-12 LAB — HCG, QUANTITATIVE, PREGNANCY: hCG, Beta Chain, Quant, S: 37596 m[IU]/mL — ABNORMAL HIGH (ref <5)

## 2021-01-12 LAB — WET PREP, GENITAL
Clue Cells Wet Prep HPF POC: NONE SEEN
Trich, Wet Prep: NONE SEEN
WBC, Wet Prep HPF POC: NONE SEEN
Yeast Wet Prep HPF POC: NONE SEEN

## 2021-01-12 LAB — I-STAT BETA HCG BLOOD, ED (MC, WL, AP ONLY): I-stat hCG, quantitative: >2000 m[IU]/mL — ABNORMAL HIGH (ref <5)

## 2021-01-12 NOTE — Discharge Instructions (Signed)
Hematoma subcorinico Subchorionic Hematoma  Un hematoma es una acumulacin de sangre fuera de los vasos sanguneos. Un hematoma subcorinico es una acumulacin de sangre entre la pared externa del embrin (corion) y la pared interna del tero. Esta afeccin puede causar hemorragia vaginal. Generalmente, los hematomas pequeos que ocurren al principio del Psychiatrist se reducen por su propia cuenta y no afectan al beb ni al [employer]. Cuando la hemorragia comienza ms tarde en el embarazo, o el hematoma es ms grande o se produce en una paciente de edad avanzada, la afeccin puede ser ms grave. Los hematomas ms grandes aumentan las posibilidades de aborto espontneo. Esta afeccin tambin [employer] siguientes riesgos:  Separacin prematura de la placenta del tero.  Parto antes de trmino Engineer, mining).  Muerte fetal. Cules son las causas? Se desconoce la causa exacta de esta afeccin. Ocurre cuando la sangre queda atrapada entre la placenta y la pared uterina porque la placenta se ha separado del Environmental consultant original del implante. Qu incrementa el riesgo? Es ms probable que sufra esta afeccin si:  Recibi tratamiento con medicamentos para la fertilidad.  Qued embarazada a travs de la fertilizacin in vitro (FIV). Cules son los signos o sntomas? Los sntomas de esta afeccin incluyen:  Prdida o hemorragia vaginal.  Dolor abdominal. Esto es poco frecuente. En ocasiones, puede no haber sntomas y Soil scientist solo se puede ver cuando se toman imgenes ecogrficas (ecografa transvaginal). Cmo se diagnostica? Esta afeccin se diagnostica en funcin de un examen fsico. Es un examen plvico. Tambin pueden hacerle otras pruebas, incluidas las siguientes:  Anlisis de [place].  Anlisis de [place].  Ecografa del abdomen. Cmo se trata? El tratamiento de esta afeccin puede variar. El tratamiento puede incluir:  Observacin cautelosa. La observarn atentamente para detectar  cualquier cambio en la hemorragia.  Medicamentos.  Restriccin de [employer]. Puede ser necesaria hasta que se detenga la hemorragia.  Un medicamento llamado inmunoglobulina Rh. Este se da si usted tiene un grupo sanguneo Rh negativo. Previene la sensibilizacin al Rh. Siga estas instrucciones en su casa:  Haga reposo en cama si se lo indica el mdico.  No levante objetos que pesen ms de 10libras (4.5kg) o el lmite que le haya indicado el mdico.  Lleve un registro escrito de la cantidad de toallas higinicas que utiliza cada da y cun empapadas (saturadas) estn.  No use tampones.  Cumpla con todas las visitas de seguimiento. Esto es importante. El profesional podr pedirle que se realice anlisis de seguimiento, ecografas o [place]. Comunquese con un mdico si:  Tiene una hemorragia vaginal.  Tiene fiebre. Solicite ayuda de inmediato si:  Siente calambres intensos en el estmago, en la espalda, en el abdomen o en la pelvis.  Elimina cogulos o tejidos grandes. Guarde los tejidos para que su mdico los vea.  Se desmaya.  Se siente mareada o dbil. Resumen  Un hematoma subcorinico es una acumulacin de sangre entre la pared externa del embrin (corion) y la pared interna del tero.  Esta afeccin puede causar hemorragia vaginal.  En ocasiones, puede no haber sntomas y Soil scientist solo se puede ver cuando se toman imgenes ecogrficas.  El tratamiento puede incluir una observacin cautelosa, medicamentos o restriccin de [place].  Cumpla con todas las visitas de seguimiento. Solicite ayuda de inmediato si tiene clicos fuertes o una hemorragia vaginal intensa. Esta informacin no tiene Theme park manager el consejo del mdico. Asegrese de hacerle al mdico cualquier pregunta que tenga. Document Revised: 08/31/2020 Document Reviewed: 08/31/2020 Elsevier  Elsevier Patient Education  2021 Elsevier Inc.   

## 2021-01-12 NOTE — MAU Provider Note (Signed)
History     CSN: [id number]  Arrival date and time: 01/12/21 1811   Event Date/Time   First Provider Initiated Contact with Patient 01/12/21 1957      Chief Complaint  Patient presents with  . Vaginal Bleeding   HPI Shelly Reed is a 30 y.o. [id number] at [redacted]w[redacted]d who presents with vaginal bleeding. Reports some brown spotting for the last few days. After intercourse this morning had heavier bright red bleeding. Passed some small clots. Denies abdominal pain.   OB History    Gravida  4   Para  2   Term  2   Preterm      AB  1   Living  2     SAB  1   IAB      Ectopic      Multiple  0   Live Births  2           Past Medical History:  Diagnosis Date  . Anemia   . Ovarian cyst     Past Surgical History:  Procedure Laterality Date  . CHOLECYSTECTOMY    . OVARIAN CYST SURGERY      No family history on file.  Social History   Tobacco Use  . Smoking status: Former Games developer  . Smokeless tobacco: Never Used  Vaping Use  . Vaping Use: Never used  Substance Use Topics  . Alcohol use: Never  . Drug use: Never    Allergies: No Known Allergies  No medications prior to admission.    Review of Systems  Constitutional: Negative.   Gastrointestinal: Negative.   Genitourinary: Positive for vaginal bleeding.   Physical Exam   Blood pressure 126/78, pulse 80, temperature 98.3 F (36.8 C), resp. rate 18, last menstrual period 11/18/2020, SpO2 100 %, unknown if currently breastfeeding.  Physical Exam Vitals and nursing note reviewed. Exam conducted with a chaperone present.  Constitutional:      General: She is not in acute distress.    Appearance: Normal appearance. She is normal weight.  HENT:     Head: Normocephalic and atraumatic.  Pulmonary:     Effort: Pulmonary effort is normal. No respiratory distress.  Abdominal:     General: Abdomen is flat. There is no distension.     Palpations: Abdomen is soft.     Tenderness: There is no  abdominal tenderness.  Genitourinary:    General: Normal vulva.     Exam position: Lithotomy position.     Cervix: No cervical motion tenderness or friability.     Uterus: Normal. Not enlarged.      Comments: Dark red blood cleared out with 3 fox swabs. No clots. Cervix closed.  Skin:    General: Skin is warm and dry.  Neurological:     Mental Status: She is alert.  Psychiatric:        Mood and Affect: Mood normal.        Behavior: Behavior normal.     MAU Course  Procedures Results for orders placed or performed during the hospital encounter of 01/12/21 (from the past 24 hour(s))  I-Stat Beta hCG blood, ED ([facility], [facility], [facility] only)     Status: Abnormal   Collection Time: 01/12/21  6:42 PM  Result Value Ref Range   I-stat hCG, quantitative >2,000.0 (H) <5 mIU/mL   Comment 3          CBC     Status: Abnormal   Collection Time: 01/12/21  7:40 PM  Result  Value Ref Range   WBC 8.2 4.0 - 10.5 K/uL   RBC 4.68 3.87 - 5.11 MIL/uL   Hemoglobin 13.4 12.0 - 15.0 g/dL   HCT 75.1 02.5 - 85.2 %   MCV 84.8 80.0 - 100.0 fL   MCH 28.6 26.0 - 34.0 pg   MCHC 33.8 30.0 - 36.0 g/dL   RDW 77.8 (H) 24.2 - 35.3 %   Platelets 230 150 - 400 K/uL   nRBC 0.0 0.0 - 0.2 %  hCG, quantitative, pregnancy     Status: Abnormal   Collection Time: 01/12/21  7:40 PM  Result Value Ref Range   hCG, Beta Chain, Quant, S 37,596 (H) <5 mIU/mL  Wet prep, genital     Status: None   Collection Time: 01/12/21  7:52 PM   Specimen: Vaginal  Result Value Ref Range   Yeast Wet Prep HPF POC NONE SEEN NONE SEEN   Trich, Wet Prep NONE SEEN NONE SEEN   Clue Cells Wet Prep HPF POC NONE SEEN NONE SEEN   WBC, Wet Prep HPF POC NONE SEEN NONE SEEN   Sperm PRESENT    US OB Comp Less 14 Wks  Result Date: 01/12/2021 CLINICAL DATA:  Vaginal bleeding. EXAM: OBSTETRIC <14 WK ULTRASOUND TECHNIQUE: Transabdominal ultrasound was performed for evaluation of the gestation as well as the maternal uterus and adnexal regions. COMPARISON:   None. FINDINGS: Intrauterine gestational sac: Single Yolk sac:  Visualized. Embryo:  Visualized. Cardiac Activity: Visualized. Heart Rate: 123 bpm CRL:   10.0 mm   7 w 1 d                  [place] EDC: August 22, 2021 Subchorionic hemorrhage: A 4.7 cm x 2.3 cm x 3.0 cm subchorionic hemorrhage is seen. Maternal uterus/adnexae: The bilateral ovaries are visualized and are normal in appearance. No pelvic free fluid is seen. IMPRESSION: 1. Single, viable intrauterine pregnancy at approximately 7 weeks and 1 day gestation by ultrasound evaluation. 2. Large subchorionic hemorrhage. Electronically Signed   By: Aram Candela M.D.   On: 01/12/2021 20:56    MDM +UPT UA, wet prep, GC/chlamydia, CBC, ABO/Rh, quant hCG, and [place] today to rule out ectopic pregnancy which can be life threatening.   RH positive  Ultrasound shows live IUP & subchorionic hemorrhage  Spanish interpreter at bedside for this encounter Assessment and Plan   1. Normal IUP (intrauterine pregnancy) on prenatal ultrasound, first trimester   2. Vaginal bleeding in pregnancy, first trimester   3. Subchorionic hematoma in first trimester, single or unspecified fetus   4. [redacted] weeks gestation of pregnancy    -reviewed bleeding precautions -pelvic rest -start prenatal care - plans to [facility] 01/12/2021, 10:16 PM

## 2021-01-12 NOTE — MAU Note (Signed)
Pt stated she started bleeding today around 5:30. Had had intercourse just prior to bleeding. Denies any pain or cramping at this time. Pt reports she has had brown spotting on and off for a few days prior to the bright red bleeding or today.

## 2021-01-12 NOTE — ED Triage Notes (Signed)
Emergency Medicine Provider Triage Evaluation Note  Adahlia Stembridge , a 30 y.o. female  was evaluated in triage.  Pt complains of vaginla bleeding in pregnancy. She is [id number]- She started bleeding heavily about 1 hour ago. She feels that there is something bulging from her vagina.   Review of Systems  Positive: Vag bleeding Negative: pain  Physical Exam  BP 118/82 (BP Location: Left Arm)   Pulse 88   Temp 98.3 F (36.8 C)   Resp 20   SpO2 100%  Gen:   Awake, no distress   HEENT:  Atraumatic  Resp:  Normal effort  Cardiac:  Normal rate  Abd:   Nondistended, nontender  MSK:   Moves extremities without difficulty  Neuro:  Speech clear   Medical Decision Making  Medically screening exam initiated at 7:05 PM.  Appropriate orders placed.  Shawnie Nicole was informed that the remainder of the evaluation will be completed by another provider, this initial triage assessment does not replace that evaluation, and the importance of remaining in the ED until their evaluation is complete.  Clinical Impression  Threatened abortion DIscussed with MAU APP  [facility]) who has accepted the patient in transfer.   Arthor Captain, PA-C 01/12/21 1907

## 2021-01-12 NOTE — ED Notes (Signed)
Olegario Messier in MAU notified of pt and pending HCG.

## 2021-01-12 NOTE — ED Notes (Signed)
Shelly Reed at triage for Shelly Reed.

## 2021-01-12 NOTE — ED Triage Notes (Signed)
Pt states she is 2 months pregnant.  C/o vaginal bleeding x 1 hour.  Denies pain.

## 2021-01-13 LAB — GC/CHLAMYDIA PROBE AMP (~~LOC~~) NOT AT ARMC
Chlamydia: POSITIVE — AB
Comment: NEGATIVE
Comment: NORMAL
Neisseria Gonorrhea: NEGATIVE

## 2021-01-15 ENCOUNTER — Telehealth: Payer: Self-pay | Admitting: Student

## 2021-01-15 DIAGNOSIS — A749 Chlamydial infection, unspecified: Secondary | ICD-10-CM

## 2021-01-15 MED ORDER — AZITHROMYCIN 500 MG PO TABS
1000.0000 mg | ORAL_TABLET | Freq: Once | ORAL | 0 refills | Status: AC
Start: 2021-01-15 — End: 2021-01-15

## 2021-01-15 NOTE — Telephone Encounter (Addendum)
Shelly Reed tested positive for  Chlamydia. Patient was called by RN and allergies and pharmacy confirmed. Rx sent to pharmacy of choice.   Judeth Horn, NP 01/15/2021 7:55 PM       ----- Message from Kathe Becton, RN sent at 01/15/2021  1:23 PM EDT ----- This patient tested positive for :  Chlamydia. She "has NKDA", I have informed the patient of her results and confirmed her pharmacy is correct in her chart. Please send Rx.   Thank you,   Kathe Becton, RN   Results faxed to [facility] Department.

## 2021-01-31 ENCOUNTER — Other Ambulatory Visit: Payer: Self-pay

## 2021-01-31 ENCOUNTER — Encounter (HOSPITAL_COMMUNITY): Payer: Self-pay | Admitting: Obstetrics and Gynecology

## 2021-01-31 ENCOUNTER — Inpatient Hospital Stay (HOSPITAL_COMMUNITY): Payer: Self-pay

## 2021-01-31 ENCOUNTER — Inpatient Hospital Stay (HOSPITAL_COMMUNITY)
Admission: AD | Admit: 2021-01-31 | Discharge: 2021-01-31 | Disposition: A | Discharge status: Home / Self Care | Payer: Self-pay | Attending: Obstetrics and Gynecology | Admitting: Obstetrics and Gynecology

## 2021-01-31 DIAGNOSIS — Z3A10 10 weeks gestation of pregnancy: Secondary | ICD-10-CM | POA: Insufficient documentation

## 2021-01-31 DIAGNOSIS — Z87891 Personal history of nicotine dependence: Secondary | ICD-10-CM | POA: Insufficient documentation

## 2021-01-31 DIAGNOSIS — O039 Complete or unspecified spontaneous abortion without complication: Secondary | ICD-10-CM

## 2021-01-31 DIAGNOSIS — O208 Other hemorrhage in early pregnancy: Secondary | ICD-10-CM | POA: Insufficient documentation

## 2021-01-31 DIAGNOSIS — O418X90 Other specified disorders of amniotic fluid and membranes, unspecified trimester, not applicable or unspecified: Secondary | ICD-10-CM

## 2021-01-31 DIAGNOSIS — O468X9 Other antepartum hemorrhage, unspecified trimester: Secondary | ICD-10-CM

## 2021-01-31 DIAGNOSIS — O209 Hemorrhage in early pregnancy, unspecified: Secondary | ICD-10-CM

## 2021-01-31 MED ORDER — OXYCODONE-ACETAMINOPHEN 5-325 MG PO TABS: 2.0000 | ORAL_TABLET | ORAL | 0 refills | Status: DC | PRN

## 2021-01-31 MED ORDER — IBUPROFEN 800 MG PO TABS: 800.0000 mg | ORAL_TABLET | Freq: Three times a day (TID) | ORAL | 0 refills | Status: DC | PRN

## 2021-01-31 MED ORDER — MISOPROSTOL 200 MCG PO TABS
800.0000 ug | ORAL_TABLET | Freq: Once | ORAL | Status: AC
  Administered 2021-01-31: 800 ug via BUCCAL
  Filled 2021-01-31: qty 4

## 2021-01-31 NOTE — Discharge Instructions (Signed)
Aborto espontneo Miscarriage El aborto espontneo es la prdida de un embarazo antes de la semana20. A veces, el embarazo termina antes de que la mujer sepa que est embarazada. Si pierde un embarazo, hable con su mdico sobre:  Las preguntas que tenga sobre la prdida del beb.  Cmo atravesar el duelo.  Planes para un futuro embarazo. Cules son las causas? Muchas veces, se desconoce la causa de esta afeccin. Qu incrementa el riesgo? Estas cosas pueden hacer que una embarazada sea ms propensa a perder un embarazo: Ciertos problemas mdicos  Trastornos que afectan las hormonas, como los siguientes: ? Enfermedad tiroidea. ? Sndrome del ovario poliqustico.  Diabetes.  Una enfermedad que provoca que el sistema del cuerpo que combate las enfermedades se ataque a s mismo por error.  Infecciones.  Problemas de sangrado.  Tener mucho sobrepeso. Factores de estilo de vida  Consumir productos que contienen nicotina o tabaco.  Estar cerca de humo de tabaco.  Beber alcohol.  Consumir mucha cafena.  Consumir drogas. Problemas relacionados con las partes o los rganos genitales  Sufrir la apertura y el borrado del cuello uterino antes de la fecha estimada de parto. El cuello uterino es la parte ms baja del tero.  Tener sndrome de [place], que deriva en: ? Cicatrices en el tero. ? Forma anormal del tero.  Tumores (fibromas) en el tero.  Problemas en el cuerpo que estn presentes desde el nacimiento.  Infeccin en el cuello uterino o el tero. Antecedentes personales o de salud  Lesiones.  Haber perdido un embarazo antes.  Ser menor de 18aos o mayor de 35aos de edad.  Estar cerca de una sustancia nociva, como la radiacin.  Que haya plomo u otros metales pesados en: ? Cosas que come o bebe. ? El aire que la rodea.  Tomar ciertos medicamentos. Cules son los signos o sntomas?  Sangre o manchas de sangre procedentes de la vagina. Tambin  puede tener clicos o dolor.  Dolor o clicos en la barriga o en la parte baja de la espalda.  Salida de lquido o tejido por la vagina. Cmo se trata? A veces, el tratamiento no es necesario. Si necesita tratamiento, es posible que se la trate con:  Un procedimiento para abrir ms el cuello uterino y sacar tejido del tero.  Medicamentos. Tal vez le den una inyeccin de un medicamento llamado inmunoglobulina Rho(D). Siga estas instrucciones en su casa: Medicamentos  Use los medicamentos de venta libre y los recetados solamente como se lo haya indicado el mdico.  Si le recetaron un antibitico, tmelo como se lo haya indicado el mdico. No deje de tomarlo aunque comience a sentirse mejor. Actividad  Haga reposo como se lo haya indicado el mdico. Pregntele al mdico qu actividades son seguras para usted.  Pida ayuda para realizar las tareas de la casa durante este tiempo. Instrucciones generales  Observe cunto tejido sale de la vagina.  Observe el tamao de cualquier cogulo de sangre que salga de la vagina.  No tenga relaciones sexuales ni se haga duchas vaginales hasta que el mdico la autorice.  No se ponga cosas, como tampones, en la vagina hasta que el mdico la autorice.  Para ayudarlos a usted y a su pareja con el duelo: ? Hable con el mdico. ? Vaya al psiclogo.  Cuando est lista, hable con el mdico sobre: ? Las cosas que puede hacer por su salud. ? Cmo puede estar sana si queda embarazada de nuevo.  Cumpla con todas las visitas   de seguimiento.   Dnde buscar ms informacin  The American College of Obstetricians and Gynecologists (Colegio Estadounidense de Obstetras y Gineclogos): acog.org  U.S. Department of Health and Human Services, Office on Women's Health (Departamento de Salud y Servicios Humanos de los EstadosUnidos, Oficina de Salud de la Mujer): hrsa.gov/office-womens-health Comunquese con un mdico si:  Tiene fiebre o escalofros.  Le  sale lquido con mal olor de la vagina.  Aumenta el sangrado.  Le salen cogulos de sangre o tejido de la vagina. Solicite ayuda de inmediato si:  Tiene espasmos o dolor muy intensos en el abdomen o en la espalda.  Llena ms de 2apsitos sanitarios grandes en una hora durante ms de 2horas.  Se siente dbil o mareada.  Se desmaya.  Se siente triste y tiene pensamientos tristes gran parte del tiempo.  Piensa en hacerse dao. Busque ayuda de inmediatosi alguna vez siente que puede hacerse dao a usted misma o a otros, o tiene pensamientos de poner fin a su vida. Dirjase al centro de urgencias ms cercano o:  Comunquese con el servicio de emergencias de su localidad (911 en los [place]).  Llame a National Suicide Prevention Lifeline (Lnea Telefnica Nacional para la Prevencin del Suicidio) al 1-800-273-8255. Est disponible las 24 horas del da.  Enve un mensaje de texto a la lnea para casos de crisis al [id number]. Resumen  El aborto espontneo es la prdida de un embarazo antes de la semana20. A veces, el embarazo termina antes de que la mujer sepa que est embarazada.  Siga las instrucciones del mdico respecto de los medicamentos y la actividad.  Para que usted y su pareja puedan sobrellevar el duelo, hablen con el mdico o con un psiclogo.  Cumpla con todas las visitas de seguimiento. Esta informacin no tiene como fin reemplazar el consejo del mdico. Asegrese de hacerle al mdico cualquier pregunta que tenga. Document Revised: 05/23/2020 Document Reviewed: 05/23/2020 Elsevier Patient Education  2021 Elsevier Inc.  

## 2021-01-31 NOTE — MAU Provider Note (Addendum)
Chief Complaint: Abdominal Pain and Vaginal Bleeding   Event Date/Time   First Provider Initiated Contact with Patient 01/31/21 0722        SUBJECTIVE HPI: Shelly Reed is a 30 y.o. [id number] at [redacted]w[redacted]d by LMP who presents to maternity admissions reporting heavy bleeding this am and cramping. States now the pain is better and only "poquito".  . She denies vaginal itching/burning, urinary symptoms, h/a, dizziness, n/v, or fever/chills.     Abdominal Pain This is a new problem. The current episode started today. The onset quality is gradual. The problem occurs intermittently. The problem has been unchanged. The quality of the pain is cramping. The abdominal pain does not radiate. Pertinent negatives include no fever or nausea. She has tried nothing for the symptoms.  Vaginal Bleeding The patient's primary symptoms include pelvic pain and vaginal bleeding. The patient's pertinent negatives include no genital itching, genital lesions or genital odor. This is a new problem. The current episode started today. The problem has been gradually worsening. She is pregnant. Associated symptoms include abdominal pain. Pertinent negatives include no chills, fever or nausea. The vaginal discharge was bloody. She has been passing clots. She has not been passing tissue. Nothing aggravates the symptoms. She has tried nothing for the symptoms.   RN Note: Pt reports at 5 am she noticed a lot of water mixed with blood in the bed and coming out. Went to the restroom and passed some clots. Reports mild abd pain.   Past Medical History:  Diagnosis Date  . Anemia   . Ovarian cyst    Past Surgical History:  Procedure Laterality Date  . CHOLECYSTECTOMY    . OVARIAN CYST SURGERY     Social History   Socioeconomic History  . Marital status: Married    Spouse name: Not on file  . Number of children: Not on file  . Years of education: Not on file  . Highest education level: Not on file  Occupational  History  . Not on file  Tobacco Use  . Smoking status: Former Games developer  . Smokeless tobacco: Never Used  Vaping Use  . Vaping Use: Never used  Substance and Sexual Activity  . Alcohol use: Never  . Drug use: Never  . Sexual activity: Yes  Other Topics Concern  . Not on file  Social History Narrative  . Not on file   Social Determinants of Health   Financial Resource Strain: Not on file  Food Insecurity: Not on file  Transportation Needs: Not on file  Physical Activity: Not on file  Stress: Not on file  Social Connections: Not on file  Intimate Partner Violence: Not on file   No current facility-administered medications on file prior to encounter.   Current Outpatient Medications on File Prior to Encounter  Medication Sig Dispense Refill  . acetaminophen (TYLENOL) 325 MG tablet Take 650 mg by mouth every 6 (six) hours as needed.    . Prenatal Vit-Fe Fumarate-FA (PRENATAL MULTIVITAMIN) TABS tablet Take 1 tablet by mouth daily at 12 noon.     No Known Allergies  I have reviewed patient's Past Medical Hx, Surgical Hx, Family Hx, Social Hx, medications and allergies.   ROS:  Review of Systems  Constitutional: Negative for chills and fever.  Gastrointestinal: Positive for abdominal pain. Negative for nausea.  Genitourinary: Positive for pelvic pain and vaginal bleeding.   Review of Systems  Other systems negative   Physical Exam  Physical Exam Patient Vitals for the past 24  hrs:  BP Temp Temp src Pulse Resp SpO2  01/31/21 0630 121/74 98.4 F (36.9 C) Oral 85 15 100 %   Constitutional:  Cardiovascular: normal rate Respiratory: normal effort GI:  MS: Extremities nontender, no edema, normal ROM Neurologic: Alert and oriented x 4.   LAB RESULTS No results found for this or any previous visit (from the past 24 hour(s)).     IMAGING US OB Comp Less 14 Wks  Result Date: 01/12/2021 CLINICAL DATA:  Vaginal bleeding. EXAM: OBSTETRIC <14 WK ULTRASOUND TECHNIQUE:  Transabdominal ultrasound was performed for evaluation of the gestation as well as the maternal uterus and adnexal regions. COMPARISON:  None. FINDINGS: Intrauterine gestational sac: Single Yolk sac:  Visualized. Embryo:  Visualized. Cardiac Activity: Visualized. Heart Rate: 123 bpm CRL:   10.0 mm   7 w 1 d                  [place] EDC: August 22, 2021 Subchorionic hemorrhage: A 4.7 cm x 2.3 cm x 3.0 cm subchorionic hemorrhage is seen. Maternal uterus/adnexae: The bilateral ovaries are visualized and are normal in appearance. No pelvic free fluid is seen. IMPRESSION: 1. Single, viable intrauterine pregnancy at approximately 7 weeks and 1 day gestation by ultrasound evaluation. 2. Large subchorionic hemorrhage. Electronically Signed   By: Aram Candela M.D.   On: 01/12/2021 20:56   US OB Transvaginal  Result Date: 01/31/2021 CLINICAL DATA:  29 year old female with bleeding in the 1st trimester of pregnancy. Ob ultrasound 01/12/2021 demonstrating IUP with subchorionic hemorrhage (estimated at 7 weeks and 1 day gestation at that time). EXAM: TRANSVAGINAL OB ULTRASOUND TECHNIQUE: Transvaginal ultrasound was performed for complete evaluation of the gestation as well as the maternal uterus, adnexal regions, and pelvic cul-de-sac. COMPARISON:  01/12/2021. FINDINGS: Intrauterine gestational sac: None. Maternal uterus/adnexae: Fairly bland appearing endometrium now (image 17). However, at the cervix/vaginal fornix there is an irregular sac containing fluid and soft tissue (image 19). No superimposed pelvic free fluid. Both ovaries appear normal (the left is 1.9 x 1.9 x 2.0 cm and the right is 1.9 x 2.7 x 1.9 cm). IMPRESSION: Spontaneous abortion in progress, with suspected products of conception visible at the cervix/vaginal fornix. Electronically Signed   By: Odessa Fleming M.D.   On: 01/31/2021 07:23     MAU Management/MDM: Ordered US on admission while I finished examining another patient [place] results are now complete  and indicate GS in the vagina, SAB in process Checked on patient after [place], states pain is less. Appears comfortable Interpretor not immediately available, so will turn over care to Dr Crissie Reese  ASSESSMENT SIUP at [redacted]w[redacted]d Large Subchorionic Hemorrhage Now with increased bleeding, SAB in process Blood type O+  PLAN Care turned over to Dr Crissie Reese  Wynelle Bourgeois CNM, MSN Certified Nurse-Midwife 01/31/2021  7:22 AM    Addendum: Reviewed findings of [place] with patient showing miscarriage in process. Patient and her partner very upset with the news, wondering if she had caused this by over exertion. Reassured patient that this is common and there was nothing she did or did not do to cause this. Did say that large [facility] was likely contributory. Discussed expectant vs medication management, she prefers the latter. Will give 800 mcg buccal cytotec and discharge to home. Reviewed expectations of increased bleeding and cramping over the next several hours. Also reviewed warning/return precautions of heavy bleeding, crescendo abdominal pain, fever, chest pain, shortness of breath. Rx given for pain medicines and work letter provided for next two  days.   Discharged to home in stable condition.   Venora Maples, MD/MPH Attending Family Medicine Physician, [facility] for [facility], [facility]  9:17 AM 01/31/21

## 2021-01-31 NOTE — MAU Note (Signed)
Pt reports at 5 am she noticed a lot of water mixed with blood in the bed and coming out. Went to the restroom and passed some clots. Reports mild abd pain.

## 2021-02-12 ENCOUNTER — Encounter: Payer: Self-pay | Admitting: Obstetrics and Gynecology

## 2021-02-12 ENCOUNTER — Other Ambulatory Visit: Payer: Self-pay

## 2021-02-12 ENCOUNTER — Ambulatory Visit (INDEPENDENT_AMBULATORY_CARE_PROVIDER_SITE_OTHER): Payer: Self-pay | Admitting: Obstetrics and Gynecology

## 2021-02-12 ENCOUNTER — Other Ambulatory Visit (HOSPITAL_COMMUNITY)
Admission: RE | Admit: 2021-02-12 | Discharge: 2021-02-12 | Disposition: A | Discharge status: Home / Self Care | Payer: Self-pay | Source: Ambulatory Visit | Attending: Obstetrics and Gynecology | Admitting: Obstetrics and Gynecology

## 2021-02-12 VITALS — BP 114/83 | HR 80 | Wt 139.0 lb

## 2021-02-12 DIAGNOSIS — Z8619 Personal history of other infectious and parasitic diseases: Secondary | ICD-10-CM | POA: Insufficient documentation

## 2021-02-12 DIAGNOSIS — Z30011 Encounter for initial prescription of contraceptive pills: Secondary | ICD-10-CM

## 2021-02-12 DIAGNOSIS — O039 Complete or unspecified spontaneous abortion without complication: Secondary | ICD-10-CM | POA: Insufficient documentation

## 2021-02-12 HISTORY — DX: Personal history of other infectious and parasitic diseases: Z86.19

## 2021-02-12 MED ORDER — NORGESTIMATE-ETH ESTRADIOL 0.25-35 MG-MCG PO TABS: 1.0000 | ORAL_TABLET | Freq: Every day | ORAL | 3 refills | Status: DC

## 2021-02-12 NOTE — Patient Instructions (Signed)
Aborto espontneo Miscarriage El aborto espontneo es la prdida de un embarazo antes de la semana20. A veces, el embarazo termina antes de que la mujer sepa que est embarazada. Si pierde un embarazo, hable con su mdico sobre:  Las preguntas que tenga sobre la prdida del beb.  Cmo atravesar el duelo.  Planes para un futuro embarazo. Cules son las causas? Muchas veces, se desconoce la causa de esta afeccin. Qu incrementa el riesgo? Estas cosas pueden hacer que una embarazada sea ms propensa a perder un embarazo: Ciertos problemas mdicos  Trastornos que afectan las hormonas, como los siguientes: ? Enfermedad tiroidea. ? Sndrome del ovario poliqustico.  Diabetes.  Una enfermedad que provoca que el sistema del cuerpo que combate las enfermedades se ataque a s mismo por error.  Infecciones.  Problemas de sangrado.  Tener mucho sobrepeso. Factores de estilo de vida  Consumir productos que contienen nicotina o tabaco.  Estar cerca de humo de tabaco.  Beber alcohol.  Consumir mucha cafena.  Consumir drogas. Problemas relacionados con las partes o los rganos genitales  Sufrir la apertura y el borrado del cuello uterino antes de la fecha estimada de parto. El cuello uterino es la parte ms baja del tero.  Tener sndrome de Asherman, que deriva en: ? Cicatrices en el tero. ? Forma anormal del tero.  Tumores (fibromas) en el tero.  Problemas en el cuerpo que estn presentes desde el nacimiento.  Infeccin en el cuello uterino o el tero. Antecedentes personales o de salud  Lesiones.  Haber perdido un embarazo antes.  Ser menor de 18aos o mayor de 35aos de edad.  Estar cerca de una sustancia nociva, como la radiacin.  Que haya plomo u otros metales pesados en: ? Cosas que come o bebe. ? El aire que la rodea.  Tomar ciertos medicamentos. Cules son los signos o sntomas?  Sangre o manchas de sangre procedentes de la vagina. Tambin  puede tener clicos o dolor.  Dolor o clicos en la barriga o en la parte baja de la espalda.  Salida de lquido o tejido por la vagina. Cmo se trata? A veces, el tratamiento no es necesario. Si necesita tratamiento, es posible que se la trate con:  Un procedimiento para abrir ms el cuello uterino y sacar tejido del tero.  Medicamentos. Tal vez le den una inyeccin de un medicamento llamado inmunoglobulina Rho(D). Siga estas instrucciones en su casa: Medicamentos  Use los medicamentos de venta libre y los recetados solamente como se lo haya indicado el mdico.  Si le recetaron un antibitico, tmelo como se lo haya indicado el mdico. No deje de tomarlo aunque comience a sentirse mejor. Actividad  Haga reposo como se lo haya indicado el mdico. Pregntele al mdico qu actividades son seguras para usted.  Pida ayuda para realizar las tareas de la casa durante este tiempo. Instrucciones generales  Observe cunto tejido sale de la vagina.  Observe el tamao de cualquier cogulo de sangre que salga de la vagina.  No tenga relaciones sexuales ni se haga duchas vaginales hasta que el mdico la autorice.  No se ponga cosas, como tampones, en la vagina hasta que el mdico la autorice.  Para ayudarlos a usted y a su pareja con el duelo: ? Hable con el mdico. ? Vaya al psiclogo.  Cuando est lista, hable con el mdico sobre: ? Las cosas que puede hacer por su salud. ? Cmo puede estar sana si queda embarazada de nuevo.  Cumpla con todas las visitas   de seguimiento.   Dnde buscar ms informacin  The [employer] of Obstetricians and Gynecologists (Colegio Est[employer] de Obstetras y Scientific laboratory technician): acog.org  U.S. Department of Health and [employer], Office on [employer] Health (Departamento de Salud y Servicios Humanos de los [place] de Salud de la Mujer): http://hoffman.com/ Comunquese con un mdico si:  Lance Muss o escalofros.  Le  sale lquido con mal olor de la vagina.  Aumenta el sangrado.  Le salen cogulos de sangre o tejido de la vagina. Solicite ayuda de inmediato si:  Tiene espasmos o dolor muy intensos en el abdomen o en la espalda.  Llena ms de 2apsitos sanitarios grandes en una hora durante ms de 2horas.  Se siente dbil o mareada.  Se desmaya.  Se siente triste y tiene pensamientos tristes gran parte del [place].  Piensa en hacerse dao. Busque ayuda de inmediatosi alguna vez siente que puede hacerse dao a usted [place] o a otros, o tiene pensamientos de Patent examiner a su vida. Dirjase al centro de urgencias ms cercano o:  Comunquese con el servicio de emergencias de su localidad (911 en los [place]).  Llame a National Suicide Prevention Lifeline (Lnea Telefnica Nacional para la Prevencin del Suicidio) al 520-228-0023. Est disponible las 24 horas del da.  Enve un mensaje de texto a la lnea para casos de crisis al (313)407-6547. Resumen  El aborto espontneo es la prdida de un embarazo antes de la semana20. A veces, el embarazo termina antes de que la mujer sepa que est [place].  Siga las instrucciones del mdico respecto de los medicamentos y la [place].  Para que usted y su pareja puedan sobrellevar el duelo, hablen con el mdico o con Development worker, community.  Cumpla con todas las visitas de seguimiento. Esta informacin no tiene Theme park manager el consejo del mdico. Asegrese de hacerle al mdico cualquier pregunta que tenga. Document Revised: 05/23/2020 Document Reviewed: 05/23/2020 Elsevier Patient Education  2021 Elsevier Inc.   Miscarriage A miscarriage is the loss of a pregnancy before the 20th week of pregnancy. Sometimes, a pregnancy ends before a woman knows that she is pregnant. If you lose a pregnancy, talk with your doctor about:  Questions you have about the loss of your baby.  How to work through your grief.  Plans for future pregnancy. What are the  causes? Many times, the cause of this condition is not known. What increases the risk? These things may make a pregnant woman more likely to lose a pregnancy: Certain health conditions  Conditions that affect hormones, such as: ? Thyroid disease. ? Polycystic ovary syndrome.  Diabetes.  A disease that causes the body's disease-fighting system to attack itself by mistake.  Infections.  Bleeding problems.  Being very overweight. Lifestyle factors  Using products that have tobacco or nicotine in them.  Being around tobacco smoke.  Having alcohol.  Having a lot of caffeine.  Using drugs. Problems with reproductive organs or parts  Having a cervix that opens and thins before your due date. The cervix is the lowest part of your womb.  Having Asherman syndrome, which leads to: ? Scars in the womb. ? The womb being abnormal in shape.  Growths (fibroids) in the womb.  Problems in the body that are present at birth.  Infection of the cervix or womb. Personal or health history  Injury.  Having lost a pregnancy before.  Being younger than age 37 or older than age 69.  Being around a harmful substance, such as  radiation.  Having lead or other heavy metals in: ? Things you eat or drink. ? The air around you.  Using certain medicines. What are the signs or symptoms?  Blood or spots of blood coming from the vagina. You may also have cramps or pain.  Pain or cramps in the belly or low back.  Fluid or tissue coming out of the vagina. How is this treated? Sometimes, treatment is not needed. If you need treatment, you may be treated with:  A procedure to open the cervix more and take tissue out of the womb.  Medicines. You may get a shot of medicine called Rho(D) immune globulin. Follow these instructions at home: Medicines  Take over-the-counter and prescription medicines only as told by your doctor.  If you were prescribed antibiotic medicine, take it as  told by your doctor. Do not stop taking it even if you start to feel better. Activity  Rest as told by your doctor. Ask your doctor what activities are safe for you.  Have someone help you at home during this time. General instructions  Watch how much tissue comes out of the vagina.  Watch the size of any blood clots that come out of the vagina.  Do not have sex or douche until your doctor says it is okay.  Do not put things, such as tampons, in your vagina until your doctor says it is okay.  To help you and your partner with grieving: ? Talk with your doctor. ? See a Veterinary surgeon.  When you are ready, talk with your doctor about: ? Things to do for your health. ? How you can be healthy if you get pregnant again.  Keep all follow-up visits.   Where to find more information  The [employer] of Obstetricians and Gynecologists: acog.org  U.S. Department of Health and Cytogeneticist of Women's Health: http://hoffman.com/ Contact a doctor if:  You have a fever or chills.  There is bad-smelling fluid coming from the vagina.  You have more bleeding.  Tissue or clots of blood come out of your vagina. Get help right away if:  You have very bad cramps or pain in your back or belly.  You soak more than 2 large pads in an hour for more than 2 hours.  You get light-headed or weak.  You faint.  You feel sad, and you have sad thoughts a lot of the time.  You think about hurting yourself. Get help right awayif you feel like you may hurt yourself or others, or have thoughts about taking your own life. Go to your nearest emergency room or:  Call your local emergency services (911 in the [place]  Call the National Suicide Prevention Lifeline at 607-298-6816. This is open 24 hours a day.  Text the Crisis Text Line at (787)591-8073. Summary  A miscarriage is the loss of a pregnancy before the 20th week of pregnancy. Sometimes, a pregnancy ends before a woman knows  that she is pregnant.  Follow instructions from your doctor about medicines and activity.  To help you and your partner with grieving, talk with your doctor or a counselor.  Keep all follow-up visits. This information is not intended to replace advice given to you by your health care provider. Make sure you discuss any questions you have with your health care provider. Document Revised: 04/20/2020 Document Reviewed: 04/20/2020 Elsevier Patient Education  2021 [employer].

## 2021-02-12 NOTE — Progress Notes (Signed)
GYNECOLOGY OFFICE VISIT NOTE  History:  30 y.o. [id number] here today for follow-up of recent spontaneous abortion. Pt was recently seen in MAU on 01/31/21 at which time ultrasound diagnosed miscarriage in process, likely secondary to large subchorionic hemorrhage. Of note, this pregnancy was strongly desired. Pt was also treated for Chlamydia on 3/15 with azithromycin but her partner has not yet been treated. No sexual activity since her appointment in MAU for miscarriage. No safety concerns. Pt reports her partner has been very supportive. She declines BH referral today. She is interested in restarting OCPs for several months before trying for another pregnancy. She denies any abnormal vaginal discharge, bleeding, pelvic pain or other concerns.    Past Medical History:  Diagnosis Date  . Anemia   . Gestational hypertension, third trimester 01/16/2020  . Ovarian cyst     Past Surgical History:  Procedure Laterality Date  . CHOLECYSTECTOMY    . OVARIAN CYST SURGERY       Current Outpatient Medications:  .  norgestimate-ethinyl estradiol (ORTHO-CYCLEN) 0.25-35 MG-MCG tablet, Take 1 tablet by mouth daily., Disp: 84 tablet, Rfl: 3 .  acetaminophen (TYLENOL) 325 MG tablet, Take 650 mg by mouth every 6 (six) hours as needed. (Patient not taking: Reported on 02/12/2021), Disp: , Rfl:  .  ibuprofen (ADVIL) 800 MG tablet, Take 1 tablet (800 mg total) by mouth every 8 (eight) hours as needed. (Patient not taking: Reported on 02/12/2021), Disp: 30 tablet, Rfl: 0 .  oxyCODONE-acetaminophen (PERCOCET/ROXICET) 5-325 MG tablet, Take 2 tablets by mouth every 4 (four) hours as needed for severe pain. (Patient not taking: Reported on 02/12/2021), Disp: 6 tablet, Rfl: 0 .  Prenatal Vit-Fe Fumarate-FA (PRENATAL MULTIVITAMIN) TABS tablet, Take 1 tablet by mouth daily at 12 noon. (Patient not taking: Reported on 02/12/2021), Disp: , Rfl:   The following portions of the patient's history were reviewed and updated as  appropriate: allergies, current medications, past family history, past medical history, past social history, past surgical history and problem list.   Health Maintenance:  Last pap: reported normal in 2019 Last mammogram: n/a  Review of Systems:  Pertinent items noted in HPI and remainder of comprehensive ROS otherwise negative.   Objective:  Physical Exam BP 114/83   Pulse 80   Wt 139 lb (63 kg)   LMP 11/18/2020   Breastfeeding Unknown   BMI 27.15 kg/m  CONSTITUTIONAL: Well-developed, well-nourished female in no acute distress. Appropriately tearful at times when discussing miscarriage. HENT:  Normocephalic, atraumatic. External right and left ear normal. Moist mucous membranes. EYES: Conjunctivae and EOM are normal. Pupils are equal, round, and reactive to light. No scleral icterus.  NECK: Normal range of motion, supple, no masses SKIN: Skin is warm and dry. No rash noted. Not diaphoretic. No erythema. No pallor. NEUROLOGIC: Alert and oriented to person, place, and time.  PSYCHIATRIC: Normal mood and affect. Normal behavior. Normal judgment and thought content. CARDIOVASCULAR: Normal heart rate noted RESPIRATORY: Effort normal, no problems with respiration noted ABDOMEN: Soft, no distention noted.  PELVIC: deferred (pt self-collected GCC swab) MUSCULOSKELETAL: Normal range of motion. No edema noted.  Labs and Imaging US OB Transvaginal  Result Date: 01/31/2021 CLINICAL DATA:  30 year old female with bleeding in the 1st trimester of pregnancy. Ob ultrasound 01/12/2021 demonstrating IUP with subchorionic hemorrhage (estimated at 7 weeks and 1 day gestation at that time). EXAM: TRANSVAGINAL OB ULTRASOUND TECHNIQUE: Transvaginal ultrasound was performed for complete evaluation of the gestation as well as the maternal uterus, adnexal regions, and  pelvic cul-de-sac. COMPARISON:  01/12/2021. FINDINGS: Intrauterine gestational sac: None. Maternal uterus/adnexae: Fairly bland appearing  endometrium now (image 17). However, at the cervix/vaginal fornix there is an irregular sac containing fluid and soft tissue (image 19). No superimposed pelvic free fluid. Both ovaries appear normal (the left is 1.9 x 1.9 x 2.0 cm and the right is 1.9 x 2.7 x 1.9 cm). IMPRESSION: Spontaneous abortion in progress, with suspected products of conception visible at the cervix/vaginal fornix. Electronically Signed   By: Odessa Fleming M.D.   On: 01/31/2021 07:23    Assessment & Plan:  Ms. Zeanna Sunde is a 29yo [id number] female who presents for follow-up s/p a recent spontaneous abortion. Of note pt was also treated for Chlamydia on 01/15/21, but no partner treatment yet.  1. Spontaneous abortion in first trimester: Pt was recently seen in MAU on 01/31/21 at which time ultrasound diagnosed miscarriage in process, likely secondary to large subchorionic hemorrhage. Of note, this pregnancy was strongly desired. No persistent symptoms reported today. Pt reports good support from her partner. - pt declines BH referral; encouraged her to call if any mood/safety concerns - recommended continued prenatal vitamin given desire to conceive in the near future (restarting OCPs today as noted below)  2. History of chlamydia infection: Pt recently treated with azithro on 01/15/21. Pt denies GU symptoms and no sexual activity since MAU appt on 01/31/21. Pt's partner is aware of diagnosis but has not yet been treated. - repeat GCC swab (self collect) per pt request today - called pharmacy to prescribe partner treatment today - instructed no sexual activity until 1 week s/p partner's treatment to avoid re-infection  3. Contraception: Pt desires several months to recover from recent miscarriage. Request for OCP script s/p discussion with pt in clinic today. No sexual activity since pt's MAU appointment at time of miscarriage. - script for OCPs today (emphasized need to use backup method/abstinence for 2 weeks s/p initiation of OCPs  to prevent pregnancy)  Routine preventative health maintenance measures emphasized. Please refer to After Visit Summary for other counseling recommendations.   Return in 2 weeks (on 02/26/2021) for pap. (Pt declined Pap today; preferred follow-up appt.)  Sheila Oats, MD OB Fellow, Faculty Practice 02/12/2021 10:03 AM

## 2021-02-13 LAB — CERVICOVAGINAL ANCILLARY ONLY
Chlamydia: NEGATIVE
Comment: NEGATIVE
Comment: NORMAL
Neisseria Gonorrhea: NEGATIVE

## 2021-02-20 ENCOUNTER — Telehealth: Payer: Self-pay | Admitting: Obstetrics and Gynecology

## 2021-02-20 DIAGNOSIS — A749 Chlamydial infection, unspecified: Secondary | ICD-10-CM

## 2021-02-20 MED ORDER — AZITHROMYCIN 500 MG PO TABS
1000.0000 mg | ORAL_TABLET | Freq: Once | ORAL | 0 refills | Status: AC
Start: 2021-02-20 — End: 2021-02-20

## 2021-02-20 NOTE — Telephone Encounter (Signed)
Called pt to confirm partner treatment for chlamydia received. Pt reported that her partner had not yet been treated. Additionally, she had vaginal intercourse after her recent appointment. Sent new script for azithromyin 1g once for patient and also called in partner treatment for pt's partner (Elder Clydell Hakim DOB: 02/08/1997). Emphasized need to avoid sexual activity until 1 week s/p treatment. Pt in agreement with plan. No other concerns at this time.  Sheila Oats, MD OB Fellow, Faculty Practice 02/20/2021 10:07 AM

## 2021-03-08 ENCOUNTER — Ambulatory Visit: Payer: Self-pay | Admitting: Medical

## 2021-05-31 IMAGING — US US OB TRANSVAGINAL
1 series · 15 of 28 positions shown · non-contrast
Comparison: 01/12/2021.

CLINICAL DATA: 29-year-old female with bleeding in the 1st
trimester of pregnancy. Ob ultrasound 01/12/2021 demonstrating IUP
with subchorionic hemorrhage (estimated at 7 weeks and 1 day
gestation at that time).

EXAM:
TRANSVAGINAL OB ULTRASOUND
TECHNIQUE: Transvaginal ultrasound was performed for complete evaluation of the
gestation as well as the maternal uterus, adnexal regions, and
pelvic cul-de-sac.

[Series 1: us ob transvaginal · 39 acquisitions, 15 frames shown]
[im 1/39]
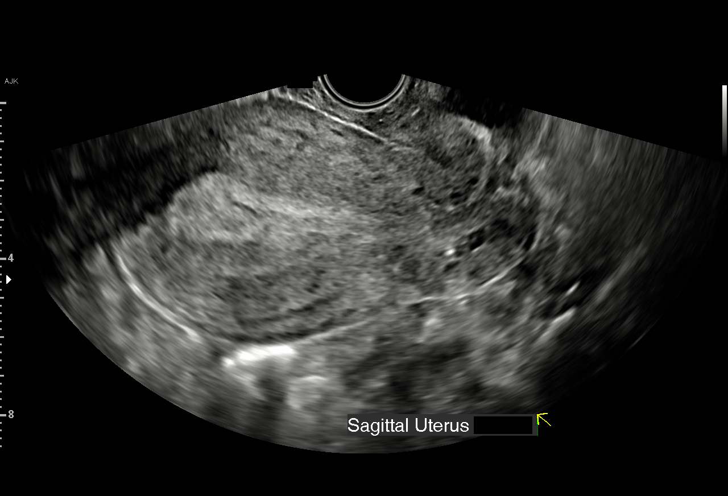
[im 3/39]
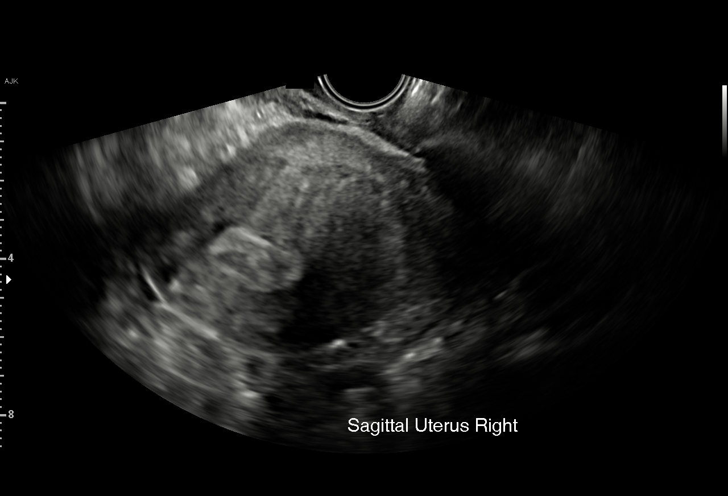
[im 6/39]
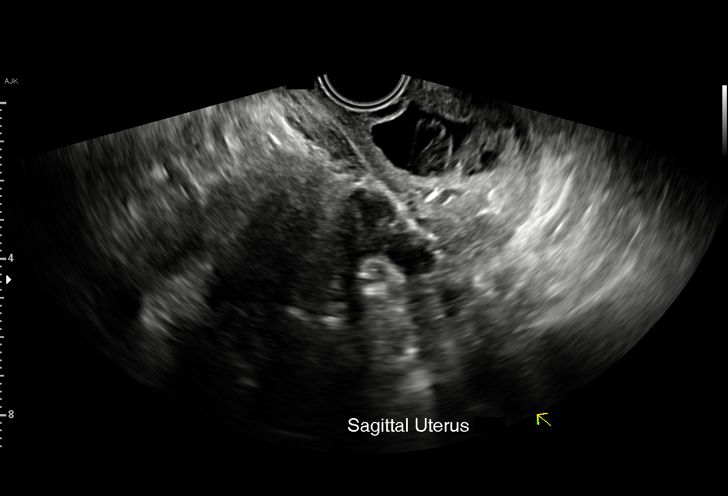
[im 9/39]
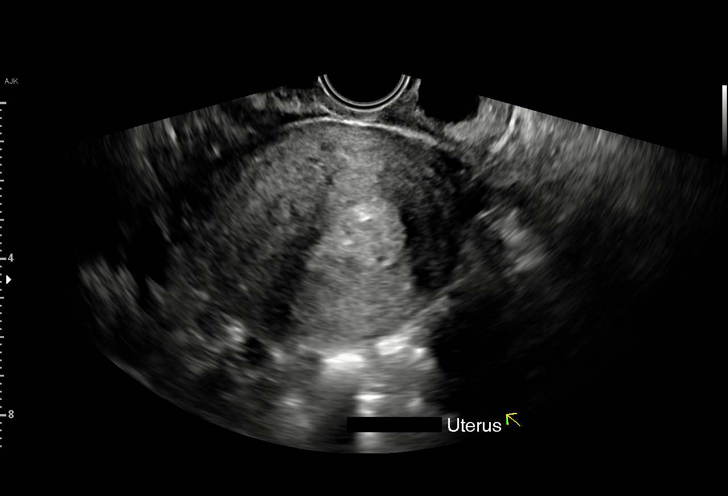
[im 12/39]
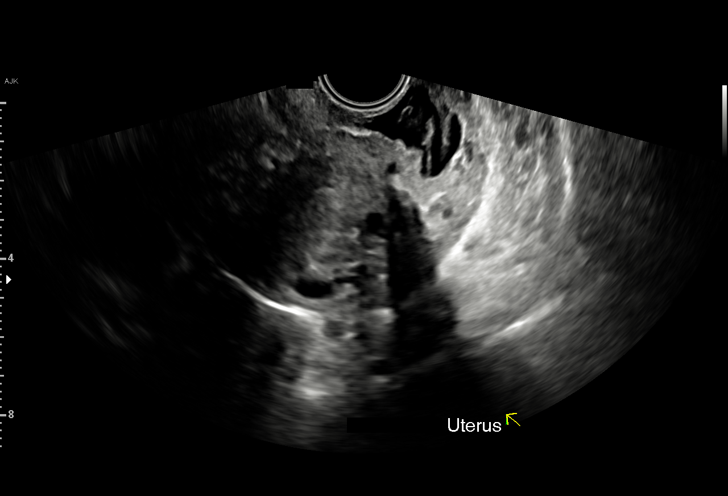
[im 15/39]
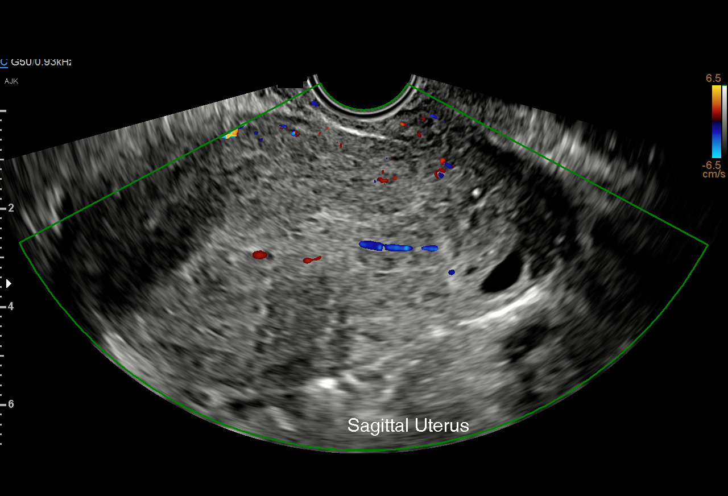
[im 17/39]
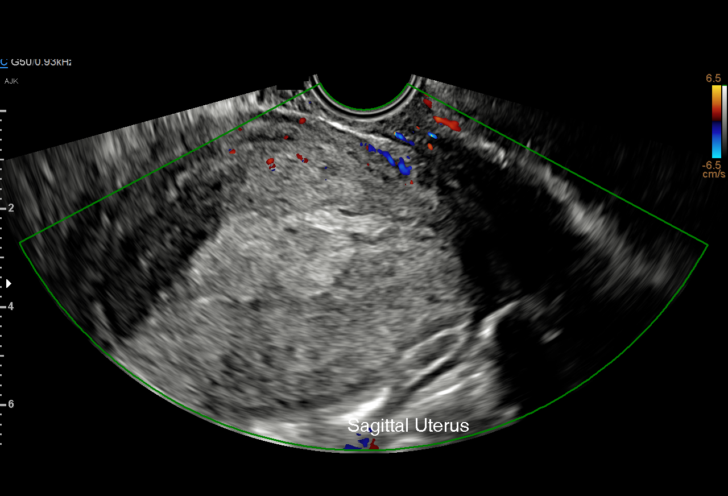
[im 20/39]
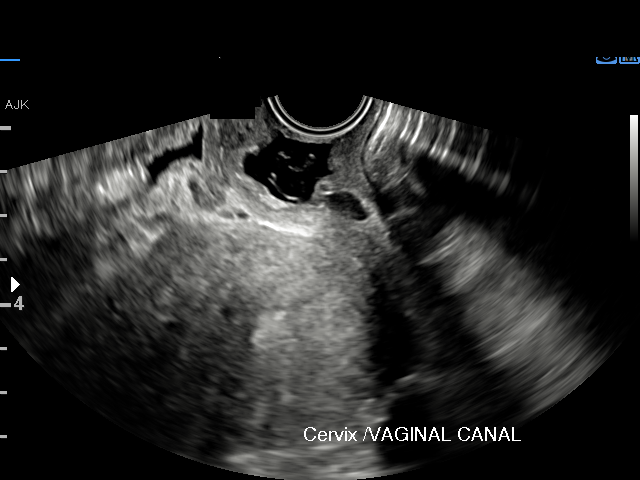
[im 22/39]
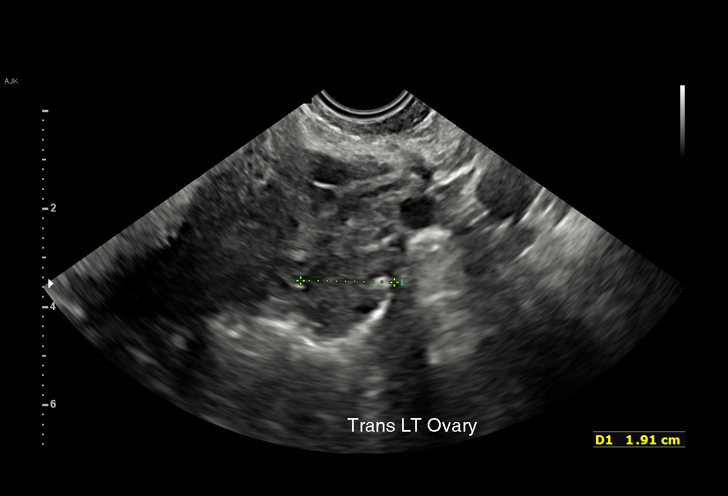
[im 24/39]
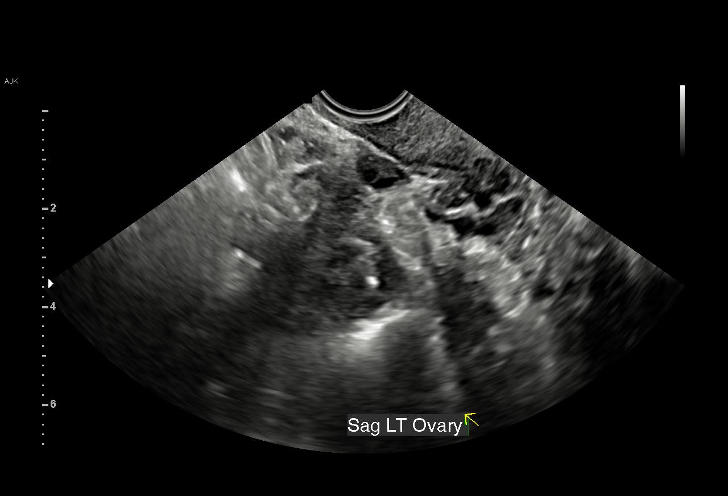
[im 27/39]
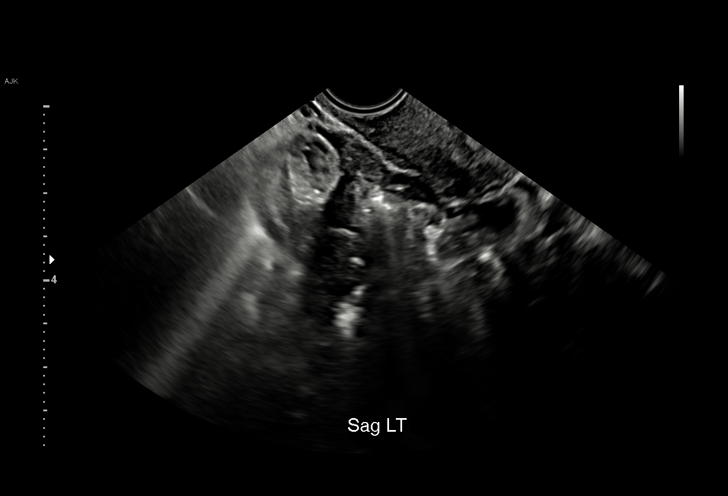
[im 30/39]
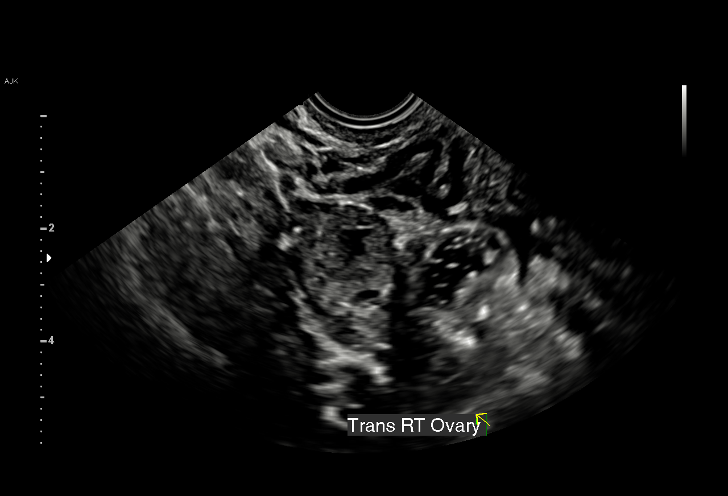
[im 33/39]
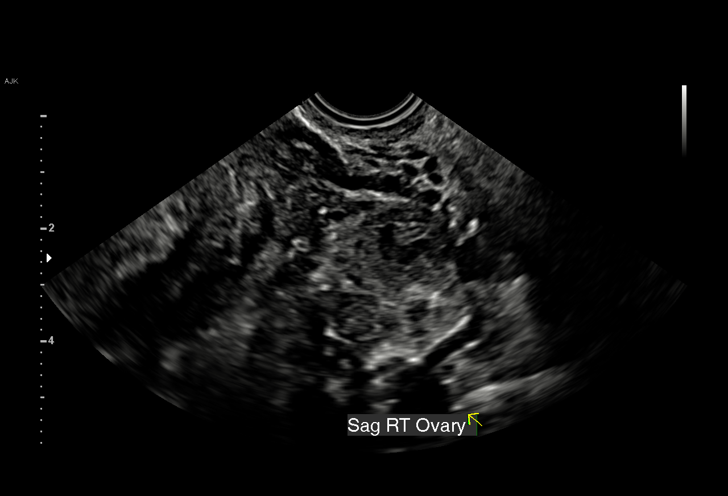
[im 36/39]
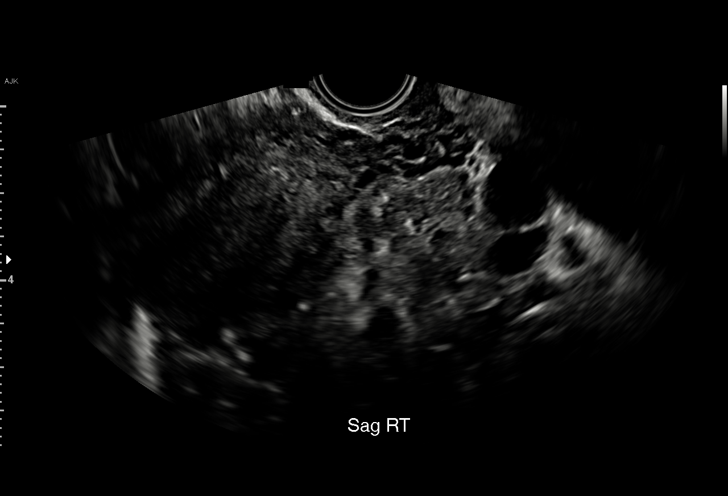
[im 39/39]
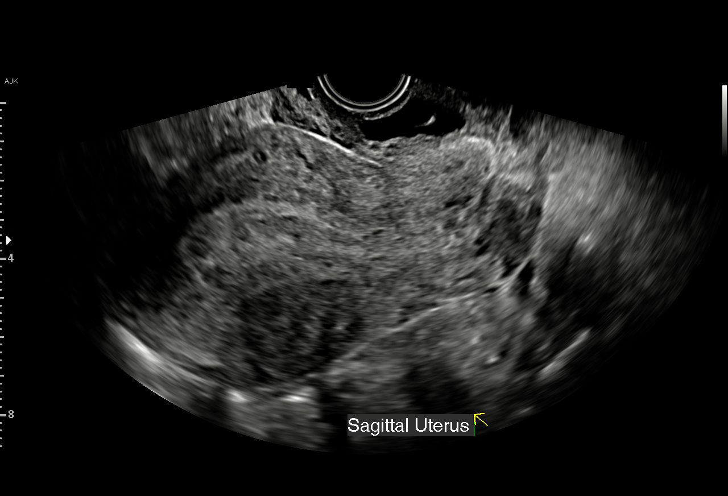

[15 of 28 positions shown; findings below may reference images not displayed]

FINDINGS: Intrauterine gestational sac: None.

Maternal uterus/adnexae:

Fairly bland appearing endometrium now (image 17). However, at the
cervix/vaginal fornix there is an irregular sac containing fluid and
soft tissue (image 19).

No superimposed pelvic free fluid. Both ovaries appear normal (the
left is 1.9 x 1.9 x 2.0 cm and the right is 1.9 x 2.7 x 1.9 cm).
IMPRESSION: Spontaneous abortion in progress, with suspected products of
conception visible at the cervix/vaginal fornix.

## 2021-06-14 ENCOUNTER — Encounter (HOSPITAL_COMMUNITY): Payer: Self-pay

## 2021-06-14 ENCOUNTER — Inpatient Hospital Stay (HOSPITAL_COMMUNITY): Payer: Self-pay

## 2021-06-14 ENCOUNTER — Inpatient Hospital Stay (HOSPITAL_COMMUNITY)
Admission: AD | Admit: 2021-06-14 | Discharge: 2021-06-14 | Disposition: A | Discharge status: Home / Self Care | Payer: Self-pay | Attending: Obstetrics & Gynecology | Admitting: Obstetrics & Gynecology

## 2021-06-14 ENCOUNTER — Other Ambulatory Visit: Payer: Self-pay

## 2021-06-14 DIAGNOSIS — Z3A08 8 weeks gestation of pregnancy: Secondary | ICD-10-CM | POA: Insufficient documentation

## 2021-06-14 DIAGNOSIS — R519 Headache, unspecified: Secondary | ICD-10-CM | POA: Insufficient documentation

## 2021-06-14 DIAGNOSIS — O210 Mild hyperemesis gravidarum: Secondary | ICD-10-CM

## 2021-06-14 DIAGNOSIS — Z87891 Personal history of nicotine dependence: Secondary | ICD-10-CM | POA: Insufficient documentation

## 2021-06-14 DIAGNOSIS — Z20822 Contact with and (suspected) exposure to covid-19: Secondary | ICD-10-CM | POA: Insufficient documentation

## 2021-06-14 DIAGNOSIS — Z3490 Encounter for supervision of normal pregnancy, unspecified, unspecified trimester: Secondary | ICD-10-CM

## 2021-06-14 DIAGNOSIS — O219 Vomiting of pregnancy, unspecified: Secondary | ICD-10-CM | POA: Insufficient documentation

## 2021-06-14 DIAGNOSIS — O26891 Other specified pregnancy related conditions, first trimester: Secondary | ICD-10-CM | POA: Insufficient documentation

## 2021-06-14 LAB — URINALYSIS, ROUTINE W REFLEX MICROSCOPIC
Bacteria, UA: NONE SEEN
Bilirubin Urine: NEGATIVE
Glucose, UA: NEGATIVE mg/dL
Hgb urine dipstick: NEGATIVE
Ketones, ur: 5 mg/dL — AB
Nitrite: NEGATIVE
Protein, ur: 30 mg/dL — AB
Specific Gravity, Urine: 1.029 (ref 1.005–1.030)
pH: 6 (ref 5.0–8.0)

## 2021-06-14 LAB — CBC
HCT: 38.4 % (ref 36.0–46.0)
Hemoglobin: 13.2 g/dL (ref 12.0–15.0)
MCH: 28.7 pg (ref 26.0–34.0)
MCHC: 34.4 g/dL (ref 30.0–36.0)
MCV: 83.5 fL (ref 80.0–100.0)
Platelets: 232 10*3/uL (ref 150–400)
RBC: 4.6 MIL/uL (ref 3.87–5.11)
RDW: 14.6 % (ref 11.5–15.5)
WBC: 8 10*3/uL (ref 4.0–10.5)
nRBC: 0 % (ref 0.0–0.2)

## 2021-06-14 LAB — COMPREHENSIVE METABOLIC PANEL
ALT: 13 U/L (ref 0–44)
AST: 15 U/L (ref 15–41)
Albumin: 3.5 g/dL (ref 3.5–5.0)
Alkaline Phosphatase: 48 U/L (ref 38–126)
Anion gap: 6 (ref 5–15)
BUN: 6 mg/dL (ref 6–20)
CO2: 24 mmol/L (ref 22–32)
Calcium: 8.9 mg/dL (ref 8.9–10.3)
Chloride: 104 mmol/L (ref 98–111)
Creatinine, Ser: 0.6 mg/dL (ref 0.44–1.00)
GFR, Estimated: >60 mL/min (ref >60)
Glucose, Bld: 103 mg/dL — ABNORMAL HIGH (ref 70–99)
Potassium: 3.6 mmol/L (ref 3.5–5.1)
Sodium: 134 mmol/L — ABNORMAL LOW (ref 135–145)
Total Bilirubin: 0.9 mg/dL (ref 0.3–1.2)
Total Protein: 6.5 g/dL (ref 6.5–8.1)

## 2021-06-14 LAB — POCT PREGNANCY, URINE: Preg Test, Ur: POSITIVE — AB

## 2021-06-14 LAB — HCG, QUANTITATIVE, PREGNANCY: hCG, Beta Chain, Quant, S: 216208 m[IU]/mL — ABNORMAL HIGH (ref <5)

## 2021-06-14 LAB — SARS CORONAVIRUS 2 (TAT 6-24 HRS): SARS Coronavirus 2: NEGATIVE

## 2021-06-14 MED ORDER — DIPHENHYDRAMINE HCL 25 MG PO TABS: 25.0000 mg | ORAL_TABLET | Freq: Four times a day (QID) | ORAL | 0 refills | Status: DC | PRN

## 2021-06-14 MED ORDER — PROCHLORPERAZINE EDISYLATE 10 MG/2ML IJ SOLN
10.0000 mg | Freq: Four times a day (QID) | INTRAMUSCULAR | Status: DC | PRN
  Administered 2021-06-14: 10 mg via INTRAVENOUS
  Filled 2021-06-14 (×2): qty 2

## 2021-06-14 MED ORDER — METOCLOPRAMIDE HCL 5 MG/ML IJ SOLN
10.0000 mg | Freq: Once | INTRAMUSCULAR | Status: AC
  Administered 2021-06-14: 10 mg via INTRAVENOUS
  Filled 2021-06-14: qty 2

## 2021-06-14 MED ORDER — DIPHENHYDRAMINE HCL 50 MG/ML IJ SOLN
25.0000 mg | Freq: Once | INTRAMUSCULAR | Status: AC
  Administered 2021-06-14: 25 mg via INTRAVENOUS
  Filled 2021-06-14: qty 1

## 2021-06-14 MED ORDER — PROMETHAZINE HCL 25 MG PO TABS: 25.0000 mg | ORAL_TABLET | Freq: Four times a day (QID) | ORAL | 0 refills | Status: DC | PRN

## 2021-06-14 NOTE — MAU Note (Signed)
..  Shelly Reed is a 30 y.o. at [redacted]w[redacted]d here in MAU reporting: n/v/d and headache for over a month. She states the health department has not given her anything for the nausea and she has been taking tylenol 1000mg  for her headache that she last took at 0600 this morning. Denies abdominal pain or abnormal discharge. Headache is 9/10.   Vitals:   06/14/21 0950  BP: 130/88  Pulse: 81  Resp: 15  Temp: 98.8 F (37.1 C)  SpO2: 100%      Lab orders placed from triage:  UA

## 2021-06-14 NOTE — MAU Provider Note (Signed)
History     CSN: [id number]  Arrival date and time: 06/14/21 6734   Event Date/Time   First Provider Initiated Contact with Patient 06/14/21 1306      Chief Complaint  Patient presents with   Headache   Nausea   Emesis   HPI Shelly Reed is a 30 y.o. [id number] in early pregnancy who presents to MAU with chief complaints of headache, nausea and vomiting. These are recurrent problems, onset one month ago. Patient receives care with [facility] and states she was was told she could only be prescribed mint for her complaints. She denies abdominal pain, weakness, syncope, constipation, dysuria.   OB History     Gravida  5   Para  2   Term  2   Preterm      AB  1   Living  2      SAB  1   IAB      Ectopic      Multiple  0   Live Births  2           Past Medical History:  Diagnosis Date   Anemia    Gestational hypertension, third trimester 01/16/2020   Ovarian cyst     Past Surgical History:  Procedure Laterality Date   CHOLECYSTECTOMY     OVARIAN CYST SURGERY      History reviewed. No pertinent family history.  Social History   Tobacco Use   Smoking status: Former   Smokeless tobacco: Never  Building services engineer Use: Never used  Substance Use Topics   Alcohol use: Never   Drug use: Never    Allergies: No Known Allergies  Medications Prior to Admission  Medication Sig Dispense Refill Last Dose   acetaminophen (TYLENOL) 500 MG tablet Take 1,000 mg by mouth every 6 (six) hours as needed.   06/14/2021 at 0600   Prenatal Vit-Fe Fumarate-FA (PRENATAL MULTIVITAMIN) TABS tablet Take 1 tablet by mouth daily at 12 noon.   06/13/2021   acetaminophen (TYLENOL) 325 MG tablet Take 650 mg by mouth every 6 (six) hours as needed. (Patient not taking: No sig reported)      ibuprofen (ADVIL) 800 MG tablet Take 1 tablet (800 mg total) by mouth every 8 (eight) hours as needed. (Patient not taking: Reported on 02/12/2021) 30 tablet 0    norgestimate-ethinyl  estradiol (ORTHO-CYCLEN) 0.25-35 MG-MCG tablet Take 1 tablet by mouth daily. 84 tablet 3    oxyCODONE-acetaminophen (PERCOCET/ROXICET) 5-325 MG tablet Take 2 tablets by mouth every 4 (four) hours as needed for severe pain. (Patient not taking: Reported on 02/12/2021) 6 tablet 0     Review of Systems  Gastrointestinal:  Positive for nausea and vomiting.  Neurological:  Positive for headaches.  All other systems reviewed and are negative. Physical Exam   Blood pressure 113/74, pulse 77, temperature 98.8 F (37.1 C), temperature source Oral, resp. rate 15, last menstrual period 04/17/2021, SpO2 100 %, unknown if currently breastfeeding.  Physical Exam Vitals and nursing note reviewed.  Constitutional:      Appearance: She is well-developed. She is obese. She is ill-appearing.  Cardiovascular:     Rate and Rhythm: Normal rate and regular rhythm.     Heart sounds: Normal heart sounds.  Pulmonary:     Effort: Pulmonary effort is normal.     Breath sounds: Normal breath sounds.  Abdominal:     General: Bowel sounds are normal.     Palpations: Abdomen is soft.  Neurological:  Mental Status: She is alert and oriented to person, place, and time.  Psychiatric:        Mood and Affect: Mood normal.        Speech: Speech normal.        Behavior: Behavior normal.    MAU Course  Procedures  Orders Placed This Encounter  Procedures   SARS CORONAVIRUS 2 (TAT 6-24 HRS) Nasopharyngeal Nasopharyngeal Swab   US OB Transvaginal   Urinalysis, Routine w reflex microscopic Urine, Clean Catch   CBC   Comprehensive metabolic panel   hCG, quantitative, pregnancy   Pregnancy, urine POC   Insert peripheral IV   Discharge patient   Patient Vitals for the past 24 hrs:  BP Temp Temp src Pulse Resp SpO2  06/14/21 1325 123/80 -- -- 97 15 100 %  06/14/21 0958 113/74 -- -- 77 -- --  06/14/21 0950 130/88 98.8 F (37.1 C) Oral 81 15 100 %   Results for orders placed or performed during the hospital  encounter of 06/14/21 (from the past 24 hour(s))  Pregnancy, urine POC     Status: Abnormal   Collection Time: 06/14/21  9:47 AM  Result Value Ref Range   Preg Test, Ur POSITIVE (A) NEGATIVE  Urinalysis, Routine w reflex microscopic Urine, Clean Catch     Status: Abnormal   Collection Time: 06/14/21 10:00 AM  Result Value Ref Range   Color, Urine YELLOW YELLOW   APPearance HAZY (A) CLEAR   Specific Gravity, Urine 1.029 1.005 - 1.030   pH 6.0 5.0 - 8.0   Glucose, UA NEGATIVE NEGATIVE mg/dL   Hgb urine dipstick NEGATIVE NEGATIVE   Bilirubin Urine NEGATIVE NEGATIVE   Ketones, ur 5 (A) NEGATIVE mg/dL   Protein, ur 30 (A) NEGATIVE mg/dL   Nitrite NEGATIVE NEGATIVE   Leukocytes,Ua MODERATE (A) NEGATIVE   RBC / HPF 0-5 0 - 5 RBC/hpf   WBC, UA 0-5 0 - 5 WBC/hpf   Bacteria, UA NONE SEEN NONE SEEN   Squamous Epithelial / LPF 11-20 0 - 5   Mucus PRESENT   CBC     Status: None   Collection Time: 06/14/21 10:13 AM  Result Value Ref Range   WBC 8.0 4.0 - 10.5 K/uL   RBC 4.60 3.87 - 5.11 MIL/uL   Hemoglobin 13.2 12.0 - 15.0 g/dL   HCT 81.1 91.4 - 78.2 %   MCV 83.5 80.0 - 100.0 fL   MCH 28.7 26.0 - 34.0 pg   MCHC 34.4 30.0 - 36.0 g/dL   RDW 95.6 21.3 - 08.6 %   Platelets 232 150 - 400 K/uL   nRBC 0.0 0.0 - 0.2 %  Comprehensive metabolic panel     Status: Abnormal   Collection Time: 06/14/21 10:13 AM  Result Value Ref Range   Sodium 134 (L) 135 - 145 mmol/L   Potassium 3.6 3.5 - 5.1 mmol/L   Chloride 104 98 - 111 mmol/L   CO2 24 22 - 32 mmol/L   Glucose, Bld 103 (H) 70 - 99 mg/dL   BUN 6 6 - 20 mg/dL   Creatinine, Ser 5.78 0.44 - 1.00 mg/dL   Calcium 8.9 8.9 - 46.9 mg/dL   Total Protein 6.5 6.5 - 8.1 g/dL   Albumin 3.5 3.5 - 5.0 g/dL   AST 15 15 - 41 U/L   ALT 13 0 - 44 U/L   Alkaline Phosphatase 48 38 - 126 U/L   Total Bilirubin 0.9 0.3 - 1.2 mg/dL  GFR, Estimated >60 >60 mL/min   Anion gap 6 5 - 15  hCG, quantitative, pregnancy     Status: Abnormal   Collection Time:  06/14/21 10:13 AM  Result Value Ref Range   hCG, Beta Chain, Quant, S 216,208 (H) <5 mIU/mL  SARS CORONAVIRUS 2 (TAT 6-24 HRS) Nasopharyngeal Nasopharyngeal Swab     Status: None   Collection Time: 06/14/21 10:38 AM   Specimen: Nasopharyngeal Swab  Result Value Ref Range   SARS Coronavirus 2 NEGATIVE NEGATIVE   US OB Transvaginal  Result Date: 06/14/2021 CLINICAL DATA:  Pregnancy of unknown anatomic location, LMP 04/17/2021, quantitative beta HCG 216,208 EXAM: TRANSVAGINAL OB ULTRASOUND TECHNIQUE: Transvaginal ultrasound was performed for complete evaluation of the gestation as well as the maternal uterus, adnexal regions, and pelvic cul-de-sac. COMPARISON:  None for this gestation FINDINGS: Intrauterine gestational sac: Present, single Yolk sac:  Present Embryo:  Present Cardiac Activity: Present Heart Rate: 161 bpm CRL:   22.6 mm   8 w 6 d                  [place] EDC: 01/18/2022 Subchorionic hemorrhage:  None visualized. Maternal uterus/adnexae: Uterus anteverted, otherwise normal appearance. RIGHT ovary normal size and morphology, 1.2 x 2.1 x 1.3 cm. LEFT ovary measures 1.5 x 2.7 x 1.9 cm and contains a small corpus luteum. Simple appearing RIGHT paraovarian cyst 19 mm diameter; no follow-up imaging recommended. No free pelvic fluid or additional adnexal masses. IMPRESSION: Single live intrauterine gestation at 8 weeks 6 days EGA by crown-rump length. Electronically Signed   By: Ulyses Southward M.D.   On: 06/14/2021 12:38    Meds ordered this encounter  Medications   metoCLOPramide (REGLAN) injection 10 mg   diphenhydrAMINE (BENADRYL) injection 25 mg   DISCONTD: prochlorperazine (COMPAZINE) injection 10 mg   promethazine (PHENERGAN) 25 MG tablet    Sig: Take 1 tablet (25 mg total) by mouth every 6 (six) hours as needed for nausea or vomiting.    Dispense:  30 tablet    Refill:  0    Order Specific Question:   Supervising Provider    Answer:   Jaynie Collins A [[id number]]   diphenhydrAMINE (BENADRYL)  25 MG tablet    Sig: Take 1 tablet (25 mg total) by mouth every 6 (six) hours as needed.    Dispense:  30 tablet    Refill:  0    Order Specific Question:   Supervising Provider    Answer:   Jaynie Collins A [[id number]]     Assessment and Plan  --30 y.o. [id number] with confirmed IUP at [redacted]w[redacted]d  --Headache resolved with Reglan and Benadryl --Tolerating PO prior to discharge --Discharge home in stable condition  Language barrier: interpreter Shelly Reed present for all patient interaction  Calvert Cantor, CNM 06/14/2021, 8:25 PM

## 2021-06-14 NOTE — Discharge Instructions (Signed)

## 2021-08-08 LAB — OB RESULTS CONSOLE HIV ANTIBODY (ROUTINE TESTING): HIV: NONREACTIVE

## 2021-08-08 LAB — OB RESULTS CONSOLE RUBELLA ANTIBODY, IGM: Rubella: IMMUNE

## 2021-08-08 LAB — OB RESULTS CONSOLE GC/CHLAMYDIA
Chlamydia: NEGATIVE
Gonorrhea: NEGATIVE

## 2021-08-08 LAB — OB RESULTS CONSOLE HEPATITIS B SURFACE ANTIGEN: Hepatitis B Surface Ag: NEGATIVE

## 2021-10-31 ENCOUNTER — Inpatient Hospital Stay (HOSPITAL_COMMUNITY)
Admission: AD | Admit: 2021-10-31 | Discharge: 2021-10-31 | Disposition: A | Discharge status: Home / Self Care | Payer: Self-pay | Attending: Obstetrics & Gynecology | Admitting: Obstetrics & Gynecology

## 2021-10-31 ENCOUNTER — Encounter (HOSPITAL_COMMUNITY): Payer: Self-pay | Admitting: Obstetrics & Gynecology

## 2021-10-31 DIAGNOSIS — Z3A28 28 weeks gestation of pregnancy: Secondary | ICD-10-CM | POA: Insufficient documentation

## 2021-10-31 DIAGNOSIS — B3731 Acute candidiasis of vulva and vagina: Secondary | ICD-10-CM

## 2021-10-31 DIAGNOSIS — Z87891 Personal history of nicotine dependence: Secondary | ICD-10-CM | POA: Insufficient documentation

## 2021-10-31 DIAGNOSIS — O368130 Decreased fetal movements, third trimester, not applicable or unspecified: Secondary | ICD-10-CM | POA: Insufficient documentation

## 2021-10-31 DIAGNOSIS — W19XXXA Unspecified fall, initial encounter: Secondary | ICD-10-CM | POA: Insufficient documentation

## 2021-10-31 DIAGNOSIS — B379 Candidiasis, unspecified: Secondary | ICD-10-CM | POA: Insufficient documentation

## 2021-10-31 DIAGNOSIS — O98813 Other maternal infectious and parasitic diseases complicating pregnancy, third trimester: Secondary | ICD-10-CM | POA: Insufficient documentation

## 2021-10-31 LAB — WET PREP, GENITAL
Clue Cells Wet Prep HPF POC: NONE SEEN
Sperm: NONE SEEN
Trich, Wet Prep: NONE SEEN
WBC, Wet Prep HPF POC: >=10 — AB (ref <10)

## 2021-10-31 MED ORDER — TERCONAZOLE 0.4 % VA CREA: 1.0000 | TOPICAL_CREAM | Freq: Every day | VAGINAL | 0 refills | Status: DC

## 2021-10-31 NOTE — MAU Provider Note (Signed)
History     CSN: [id number]  Arrival date and time: 10/31/21 0940   None     Chief Complaint  Patient presents with   Decreased Fetal Movement   HPI Shelly Reed is a 30 y.o. X3G1829 at [redacted]w[redacted]d who presents to MAU for decreased fetal movement. Patient reports decreased fetal movement for the last 4 days. Reports that she fell 5 days ago on landed on her bottom. Did not sustain any trauma to abdomen. She reports since her arrival to MAU she has felt a lot of movement. She denies contractions or vaginal bleeding. Reports about 1-2 weeks ago she had a gush of clear/milky fluid that ran down her leg, but now it is only a "jelly-like discharge". She denies itching/odor. No urinary s/s.   OB History     Gravida  5   Para  2   Term  2   Preterm      AB  2   Living  2      SAB  2   IAB      Ectopic      Multiple  0   Live Births  2           Past Medical History:  Diagnosis Date   Anemia    Gestational hypertension, third trimester 01/16/2020   Ovarian cyst     Past Surgical History:  Procedure Laterality Date   CHOLECYSTECTOMY     OVARIAN CYST SURGERY      Family History  Problem Relation Age of Onset   Healthy Mother    Healthy Father     Social History   Tobacco Use   Smoking status: Former    Types: Cigarettes   Smokeless tobacco: Former  Building services engineer Use: Never used  Substance Use Topics   Alcohol use: Never   Drug use: Never    Allergies: No Known Allergies  No medications prior to admission.    Review of Systems  Constitutional: Negative.   Respiratory: Negative.    Cardiovascular: Negative.   Gastrointestinal: Negative.   Genitourinary:  Positive for vaginal discharge. Negative for dysuria, frequency and vaginal bleeding.  Musculoskeletal: Negative.   Neurological: Negative.    Physical Exam   Blood pressure 115/75, pulse 97, temperature 98 F (36.7 C), temperature source Oral, resp. rate 16, weight 70.8  kg, last menstrual period 04/17/2021, unknown if currently breastfeeding.  Physical Exam Vitals and nursing note reviewed. Exam conducted with a chaperone present.  Constitutional:      General: She is not in acute distress.    Appearance: She is obese.  Eyes:     Extraocular Movements: Extraocular movements intact.     Conjunctiva/sclera: Conjunctivae normal.     Pupils: Pupils are equal, round, and reactive to light.  Cardiovascular:     Rate and Rhythm: Normal rate.  Pulmonary:     Effort: Pulmonary effort is normal.  Abdominal:     Palpations: Abdomen is soft.     Tenderness: There is no abdominal tenderness.  Genitourinary:    Comments: NEFG, vaginal walls pink with rugae, no bleeding, copious amount of thick, curdy discharge present, no pooling of amniotic fluid, fern negative, cervix visually closed without lesion/masses SVE: Closed/thick Musculoskeletal:        General: Normal range of motion.     Cervical back: Normal range of motion.  Skin:    General: Skin is warm and dry.  Neurological:     General: No  focal deficit present.     Mental Status: She is alert and oriented to person, place, and time.  Psychiatric:        Mood and Affect: Mood normal.        Behavior: Behavior normal.        Thought Content: Thought content normal.        Judgment: Judgment normal.   NST FHR: 135 bpm, moderate variability, +15x15 accels, no decels Toco: quiet  MAU Course  Procedures NST Wet prep, GC/CT collected  MDM NST clicker given to patient and pushed 41 times in 2 hour period. Patient reports feeling "lots of movement" since arrival. NST reassuring for gestational age SSE negative pooling, negative fern, +yeast, will send rx to pharmacy. Spanish interpretor used through entirety of visit  Assessment and Plan  [redacted] weeks gestation of pregnancy Decreased fetal movement Yeast infection  - Discharge home in stable condition - Terazol rx sent to pharmacy - Strict return  precautions reviewed at length with patient and her significant other. Patient may return to MAU as needed or for worsening symptoms -  Keep OB appointment as [facility] at [facility] on 11/05/2021   Brand Males, CNM 10/31/2021, 12:36 PM

## 2021-10-31 NOTE — MAU Note (Signed)
About 2 wks ago, had intercourse- had a little bleeding after that. About a wk she fell-landed on her but, reports decrease in FM since then. None in the last 4 days. Denies pain, no bleeding, reports ? Yellow d/c for a wk- watery- wearing a pad.   Care at [facility]

## 2021-11-01 LAB — GC/CHLAMYDIA PROBE AMP (~~LOC~~) NOT AT ARMC
Chlamydia: NEGATIVE
Comment: NEGATIVE
Comment: NORMAL
Neisseria Gonorrhea: NEGATIVE

## 2021-11-03 NOTE — L&D Delivery Note (Addendum)
OB/GYN Faculty Practice Delivery Note ? ?Shelly Reed is a 31 y.o. [phone] s/p SVD at [redacted]w[redacted]d. She was admitted for IOL d/t gHTN.  ? ?ROM: 1h 25m with pink fluid ?GBS Status: positive > PCN ?Maximum Maternal Temperature: 98.7 (postpartum rose to 102.9 in setting of rectal cytotec was given after delivery for bleeding. Tylenol given and if continues to be elevated plan for antibiotics) ? ?Labor Progress: ?IOL started with pitocin at 1604 ?SROM 2230 ? ?Delivery Date/Time: 01/17/22 2345 ?Delivery: Called to room and patient was complete and pushing. Head delivered LOA. No nuchal cord present. Shoulder and body delivered in usual fashion. Infant placed on mother's abdomen, cry after being dried and stimulated. Cord clamped x 2 after 1-minute delay, and cut by father of baby under my direct supervision. Cord blood drawn. Placenta delivered spontaneously with gentle cord traction. Fundus firm with massage and Pitocin. Lower uterine sweep performed by Dr. Ephriam Jenkins with clots removed. Labia, perineum, vagina, and cervix were inspected, L periurethral laceration present repaired with 4-0 vicryl. Rectal cytotec 1000 mg given for trickling bleeding.  ? ?Placenta: complete, three vessel cord appreciated-sent to L&D ?Complications: none ?Lacerations: L periurethral ?EBL: 200 mL ?Analgesia: Epidural  ? ? ?Infant: girl  APGARs 8,9  3380g ? ?Levin Erp, MD ?[facility] PGY-1 ?Center for [employer], [facility]  ? ? ? ?GME ATTESTATION:  ?I saw and evaluated the patient. I agree with the findings and the plan of care as documented in the resident?s note and have made all necessary edits. I was gloved and present for entire delivery.  ? ?Warner Mccreedy, MD, MPH ?OB Fellow, Faculty Practice ?[place], Center for [facility] ?01/18/2022 2:15 AM ? ?

## 2022-01-03 LAB — OB RESULTS CONSOLE GBS: GBS: POSITIVE

## 2022-01-17 ENCOUNTER — Other Ambulatory Visit: Payer: Self-pay | Admitting: Family Medicine

## 2022-01-17 ENCOUNTER — Encounter (HOSPITAL_COMMUNITY): Payer: Self-pay | Admitting: Family Medicine

## 2022-01-17 ENCOUNTER — Inpatient Hospital Stay (HOSPITAL_COMMUNITY)
Admission: AD | Admit: 2022-01-17 | Discharge: 2022-01-19 | DRG: 807 | Disposition: A | Discharge status: Home / Self Care | Payer: Medicaid Other | Attending: Obstetrics & Gynecology | Admitting: Obstetrics & Gynecology

## 2022-01-17 ENCOUNTER — Other Ambulatory Visit: Payer: Self-pay

## 2022-01-17 ENCOUNTER — Inpatient Hospital Stay (HOSPITAL_COMMUNITY): Payer: Medicaid Other | Admitting: Anesthesiology

## 2022-01-17 DIAGNOSIS — O139 Gestational [pregnancy-induced] hypertension without significant proteinuria, unspecified trimester: Secondary | ICD-10-CM

## 2022-01-17 DIAGNOSIS — O1490 Unspecified pre-eclampsia, unspecified trimester: Secondary | ICD-10-CM | POA: Diagnosis present

## 2022-01-17 DIAGNOSIS — O8612 Endometritis following delivery: Secondary | ICD-10-CM | POA: Diagnosis not present

## 2022-01-17 DIAGNOSIS — Z87891 Personal history of nicotine dependence: Secondary | ICD-10-CM | POA: Diagnosis not present

## 2022-01-17 DIAGNOSIS — N719 Inflammatory disease of uterus, unspecified: Secondary | ICD-10-CM | POA: Diagnosis not present

## 2022-01-17 DIAGNOSIS — O99824 Streptococcus B carrier state complicating childbirth: Secondary | ICD-10-CM | POA: Diagnosis present

## 2022-01-17 DIAGNOSIS — Z3A39 39 weeks gestation of pregnancy: Secondary | ICD-10-CM

## 2022-01-17 DIAGNOSIS — Z141 Cystic fibrosis carrier: Secondary | ICD-10-CM | POA: Diagnosis not present

## 2022-01-17 DIAGNOSIS — Z8619 Personal history of other infectious and parasitic diseases: Secondary | ICD-10-CM | POA: Diagnosis present

## 2022-01-17 DIAGNOSIS — O134 Gestational [pregnancy-induced] hypertension without significant proteinuria, complicating childbirth: Secondary | ICD-10-CM | POA: Diagnosis present

## 2022-01-17 DIAGNOSIS — O1404 Mild to moderate pre-eclampsia, complicating childbirth: Principal | ICD-10-CM | POA: Diagnosis present

## 2022-01-17 LAB — COMPREHENSIVE METABOLIC PANEL
ALT: 20 U/L (ref 0–44)
ALT: 19 U/L (ref 0–44)
AST: 26 U/L (ref 15–41)
AST: 23 U/L (ref 15–41)
Albumin: 3 g/dL — ABNORMAL LOW (ref 3.5–5.0)
Albumin: 2.6 g/dL — ABNORMAL LOW (ref 3.5–5.0)
Alkaline Phosphatase: 229 U/L — ABNORMAL HIGH (ref 38–126)
Alkaline Phosphatase: 220 U/L — ABNORMAL HIGH (ref 38–126)
Anion gap: 8 (ref 5–15)
Anion gap: 8 (ref 5–15)
BUN: 7 mg/dL (ref 6–20)
BUN: 8 mg/dL (ref 6–20)
CO2: 20 mmol/L — ABNORMAL LOW (ref 22–32)
CO2: 21 mmol/L — ABNORMAL LOW (ref 22–32)
Calcium: 8.8 mg/dL — ABNORMAL LOW (ref 8.9–10.3)
Calcium: 8.4 mg/dL — ABNORMAL LOW (ref 8.9–10.3)
Chloride: 110 mmol/L (ref 98–111)
Chloride: 108 mmol/L (ref 98–111)
Creatinine, Ser: 0.47 mg/dL (ref 0.44–1.00)
Creatinine, Ser: 0.56 mg/dL (ref 0.44–1.00)
GFR, Estimated: >60 mL/min (ref >60)
GFR, Estimated: >60 mL/min (ref >60)
Glucose, Bld: 83 mg/dL (ref 70–99)
Glucose, Bld: 82 mg/dL (ref 70–99)
Potassium: 3.8 mmol/L (ref 3.5–5.1)
Potassium: 3.8 mmol/L (ref 3.5–5.1)
Sodium: 138 mmol/L (ref 135–145)
Sodium: 137 mmol/L (ref 135–145)
Total Bilirubin: 0.8 mg/dL (ref 0.3–1.2)
Total Bilirubin: 1.2 mg/dL (ref 0.3–1.2)
Total Protein: 6.4 g/dL — ABNORMAL LOW (ref 6.5–8.1)
Total Protein: 5.5 g/dL — ABNORMAL LOW (ref 6.5–8.1)

## 2022-01-17 LAB — PROTEIN / CREATININE RATIO, URINE
Creatinine, Urine: 147.57 mg/dL
Creatinine, Urine: 155.91 mg/dL
Protein Creatinine Ratio: 0.28 mg/mg{Cre} — ABNORMAL HIGH (ref 0.00–0.15)
Protein Creatinine Ratio: 0.42 mg/mg{Cre} — ABNORMAL HIGH (ref 0.00–0.15)
Total Protein, Urine: 41 mg/dL
Total Protein, Urine: 65 mg/dL

## 2022-01-17 LAB — CBC
HCT: 33.9 % — ABNORMAL LOW (ref 36.0–46.0)
HCT: 34.7 % — ABNORMAL LOW (ref 36.0–46.0)
Hemoglobin: 10.8 g/dL — ABNORMAL LOW (ref 12.0–15.0)
Hemoglobin: 10.7 g/dL — ABNORMAL LOW (ref 12.0–15.0)
MCH: 26.3 pg (ref 26.0–34.0)
MCH: 26 pg (ref 26.0–34.0)
MCHC: 31.9 g/dL (ref 30.0–36.0)
MCHC: 30.8 g/dL (ref 30.0–36.0)
MCV: 82.7 fL (ref 80.0–100.0)
MCV: 84.4 fL (ref 80.0–100.0)
Platelets: 199 10*3/uL (ref 150–400)
Platelets: 184 10*3/uL (ref 150–400)
RBC: 4.1 MIL/uL (ref 3.87–5.11)
RBC: 4.11 MIL/uL (ref 3.87–5.11)
RDW: 17.9 % — ABNORMAL HIGH (ref 11.5–15.5)
RDW: 18.2 % — ABNORMAL HIGH (ref 11.5–15.5)
WBC: 8.1 10*3/uL (ref 4.0–10.5)
WBC: 8.5 10*3/uL (ref 4.0–10.5)
nRBC: 0.2 % (ref 0.0–0.2)
nRBC: 0 % (ref 0.0–0.2)

## 2022-01-17 LAB — TYPE AND SCREEN
ABO/RH(D): O POS
Antibody Screen: NEGATIVE

## 2022-01-17 MED ORDER — MISOPROSTOL 25 MCG QUARTER TABLET
25.0000 ug | ORAL_TABLET | ORAL | Status: DC | PRN
  Filled 2022-01-17: qty 1

## 2022-01-17 MED ORDER — FENTANYL-BUPIVACAINE-NACL 0.5-0.125-0.9 MG/250ML-% EP SOLN
12.0000 mL/h | EPIDURAL | Status: DC | PRN
  Administered 2022-01-17: 12 mL/h via EPIDURAL
  Filled 2022-01-17: qty 250

## 2022-01-17 MED ORDER — OXYCODONE-ACETAMINOPHEN 5-325 MG PO TABS: 2.0000 | ORAL_TABLET | ORAL | Status: DC | PRN

## 2022-01-17 MED ORDER — SODIUM CHLORIDE 0.9 % IV SOLN
5.0000 10*6.[IU] | Freq: Once | INTRAVENOUS | Status: AC
  Administered 2022-01-17: 5 10*6.[IU] via INTRAVENOUS
  Filled 2022-01-17: qty 5

## 2022-01-17 MED ORDER — DIPHENHYDRAMINE HCL 50 MG/ML IJ SOLN: 12.5000 mg | INTRAMUSCULAR | Status: DC | PRN

## 2022-01-17 MED ORDER — LACTATED RINGERS IV SOLN: INTRAVENOUS | Status: DC

## 2022-01-17 MED ORDER — LACTATED RINGERS IV SOLN: 500.0000 mL | INTRAVENOUS | Status: DC | PRN

## 2022-01-17 MED ORDER — ACETAMINOPHEN 325 MG PO TABS
650.0000 mg | ORAL_TABLET | ORAL | Status: DC | PRN
  Administered 2022-01-17 – 2022-01-18 (×2): 650 mg via ORAL
  Filled 2022-01-17 (×2): qty 2

## 2022-01-17 MED ORDER — FENTANYL CITRATE (PF) 100 MCG/2ML IJ SOLN
100.0000 ug | INTRAMUSCULAR | Status: DC | PRN
  Administered 2022-01-17: 100 ug via INTRAVENOUS
  Filled 2022-01-17: qty 2

## 2022-01-17 MED ORDER — EPHEDRINE 5 MG/ML INJ: 10.0000 mg | INTRAVENOUS | Status: DC | PRN

## 2022-01-17 MED ORDER — PHENYLEPHRINE 40 MCG/ML (10ML) SYRINGE FOR IV PUSH (FOR BLOOD PRESSURE SUPPORT): 80.0000 ug | PREFILLED_SYRINGE | INTRAVENOUS | Status: DC | PRN

## 2022-01-17 MED ORDER — LIDOCAINE HCL (PF) 1 % IJ SOLN
INTRAMUSCULAR | Status: DC | PRN
  Administered 2022-01-17 (×2): 4 mL via EPIDURAL

## 2022-01-17 MED ORDER — PENICILLIN G POT IN DEXTROSE 60000 UNIT/ML IV SOLN
3.0000 10*6.[IU] | Freq: Once | INTRAVENOUS | Status: AC
  Administered 2022-01-17: 3 10*6.[IU] via INTRAVENOUS
  Filled 2022-01-17: qty 50

## 2022-01-17 MED ORDER — LACTATED RINGERS IV SOLN
500.0000 mL | Freq: Once | INTRAVENOUS | Status: AC
  Administered 2022-01-17: 500 mL via INTRAVENOUS

## 2022-01-17 MED ORDER — OXYTOCIN-SODIUM CHLORIDE 30-0.9 UT/500ML-% IV SOLN
1.0000 m[IU]/min | INTRAVENOUS | Status: DC
  Administered 2022-01-17: 2 m[IU]/min via INTRAVENOUS
  Filled 2022-01-17: qty 500

## 2022-01-17 MED ORDER — OXYTOCIN BOLUS FROM INFUSION
333.0000 mL | Freq: Once | INTRAVENOUS | Status: AC
  Administered 2022-01-17: 333 mL via INTRAVENOUS

## 2022-01-17 MED ORDER — LIDOCAINE HCL (PF) 1 % IJ SOLN: 30.0000 mL | INTRAMUSCULAR | Status: DC | PRN

## 2022-01-17 MED ORDER — OXYTOCIN-SODIUM CHLORIDE 30-0.9 UT/500ML-% IV SOLN
2.5000 [IU]/h | INTRAVENOUS | Status: DC
Start: 2022-01-17 — End: 2022-01-18
  Administered 2022-01-18: 2.5 [IU]/h via INTRAVENOUS

## 2022-01-17 MED ORDER — ONDANSETRON HCL 4 MG/2ML IJ SOLN: 4.0000 mg | Freq: Four times a day (QID) | INTRAMUSCULAR | Status: DC | PRN

## 2022-01-17 MED ORDER — OXYCODONE-ACETAMINOPHEN 5-325 MG PO TABS: 1.0000 | ORAL_TABLET | ORAL | Status: DC | PRN

## 2022-01-17 MED ORDER — TERBUTALINE SULFATE 1 MG/ML IJ SOLN: 0.2500 mg | Freq: Once | INTRAMUSCULAR | Status: DC | PRN

## 2022-01-17 MED ORDER — SOD CITRATE-CITRIC ACID 500-334 MG/5ML PO SOLN: 30.0000 mL | ORAL | Status: DC | PRN
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
## 2022-01-17 NOTE — Anesthesia Procedure Notes (Signed)
Epidural ?Patient location during procedure: OB ?Start time: 01/17/2022 7:42 PM ?End time: 01/17/2022 7:45 PM ? ?Staffing ?Anesthesiologist: Kaylyn Layer, MD ?Performed: anesthesiologist  ? ?Preanesthetic Checklist ?Completed: patient identified, IV checked, risks and benefits discussed, monitors and equipment checked, pre-op evaluation and timeout performed ? ?Epidural ?Patient position: sitting ?Prep: DuraPrep and site prepped and draped ?Patient monitoring: continuous pulse ox, blood pressure and heart rate ?Approach: midline ?Location: L3-L4 ?Injection technique: LOR air ? ?Needle:  ?Needle type: Tuohy  ?Needle gauge: 17 G ?Needle length: 9 cm ?Needle insertion depth: 5 cm ?Catheter type: closed end flexible ?Catheter size: 19 Gauge ?Catheter at skin depth: 10 cm ?Test dose: negative and Other (1% lidocaine) ? ?Assessment ?Events: blood not aspirated, injection not painful, no injection resistance, no paresthesia and negative IV test ? ?Additional Notes ?Patient identified. Risks, benefits, and alternatives discussed with patient including but not limited to bleeding, infection, nerve damage, paralysis, failed block, incomplete pain control, headache, blood pressure changes, nausea, vomiting, reactions to medication, itching, and postpartum back pain. Confirmed with bedside nurse the patient's most recent platelet count. Confirmed with patient that they are not currently taking any anticoagulation, have any bleeding history, or any family history of bleeding disorders. Patient expressed understanding and wished to proceed. All questions were answered. Sterile technique was used throughout the entire procedure. Please see nursing notes for vital signs.  ? ?Crisp LOR on first pass. Test dose was given through epidural catheter and negative prior to continuing to dose epidural or start infusion. Warning signs of high block given to the patient including shortness of breath, tingling/numbness in hands, complete  motor block, or any concerning symptoms with instructions to call for help. Patient was given instructions on fall risk and not to get out of bed. All questions and concerns addressed with instructions to call with any issues or inadequate analgesia.  Reason for block:procedure for pain ? ? ? ?

## 2022-01-17 NOTE — Anesthesia Preprocedure Evaluation (Signed)
Anesthesia Evaluation  ?Patient identified by MRN, date of birth, ID band ?Patient awake ? ? ? ?Reviewed: ?Allergy & Precautions, Patient's Chart, lab work & pertinent test results ? ?History of Anesthesia Complications ?Negative for: history of anesthetic complications ? ?Airway ?Mallampati: II ? ?TM Distance: >3 FB ?Neck ROM: Full ? ? ? Dental ?no notable dental hx. ? ?  ?Pulmonary ?former smoker,  ?  ?Pulmonary exam normal ? ? ? ? ? ? ? Cardiovascular ?hypertension (gestational), Normal cardiovascular exam ? ? ?  ?Neuro/Psych ?negative neurological ROS ? negative psych ROS  ? GI/Hepatic ?negative GI ROS, Neg liver ROS,   ?Endo/Other  ?negative endocrine ROS ? Renal/GU ?negative Renal ROS  ?negative genitourinary ?  ?Musculoskeletal ?negative musculoskeletal ROS ?(+)  ? Abdominal ?  ?Peds ? Hematology ?negative hematology ROS ?(+)   ?Anesthesia Other Findings ?Day of surgery medications reviewed with patient. ? Reproductive/Obstetrics ?(+) Pregnancy ? ?  ? ? ? ? ? ? ? ? ? ? ? ? ? ?  ?  ? ? ? ? ? ? ? ? ?Anesthesia Physical ?Anesthesia Plan ? ?ASA: 2 ? ?Anesthesia Plan: Epidural  ? ?Post-op Pain Management:   ? ?Induction:  ? ?PONV Risk Score and Plan: Treatment may vary due to age or medical condition ? ?Airway Management Planned: Natural Airway ? ?Additional Equipment: Fetal Monitoring ? ?Intra-op Plan:  ? ?Post-operative Plan:  ? ?Informed Consent: I have reviewed the patients History and Physical, chart, labs and discussed the procedure including the risks, benefits and alternatives for the proposed anesthesia with the patient or authorized representative who has indicated his/her understanding and acceptance.  ? ? ? ?Interpreter used for interveiw ? ?Plan Discussed with:  ? ?Anesthesia Plan Comments:   ? ? ? ? ? ? ?Anesthesia Quick Evaluation ? ?

## 2022-01-17 NOTE — Progress Notes (Signed)
Shelly Reed is a 31 y.o. [id number] at [redacted]w[redacted]d by LMP admitted for induction of labor due to gestational hypertension. ? ?Objective: ?BP 132/88   Pulse 93   Temp 98 ?F (36.7 ?C) (Oral)   Resp 18   Ht 5' (1.524 m)   Wt 78.7 kg   LMP 04/17/2021   SpO2 99%   BMI 33.90 kg/m?  ?No intake/output data recorded. ?No intake/output data recorded. ? ?Assessment / Plan: ?Blood pressures reviewed through 19:52 with 130s/70s-80s. Epidural placed at 1945. PreE labs reviewed and all WNL. Plan to continue pitocin, consider AROM at next check. ? ?Pain Control:  Epidural ?Anticipated MOD:  NSVD ? ?Theresia Majors ?01/17/2022, 7:59 PM ? ? ?

## 2022-01-17 NOTE — Progress Notes (Signed)
Labor Progress Note ?Shelly Reed is a 31 y.o. [id number] at [redacted]w[redacted]d presented for IOL d/t gHTN.  ? ?S: Patient is resting comfortably. Says that she does have continued "dark spots" in her vision that have been unchanged since this morning. Did not have a headache while I was in the room but was alerted by nursing she had headache and was given tylenol.  ? ?O:  ?BP 115/72   Pulse 82   Temp 98.3 ?F (36.8 ?C) (Oral)   Resp 17   Ht 5' (1.524 m)   Wt 78.7 kg   LMP 04/17/2021   SpO2 99%   BMI 33.90 kg/m?  ?EFM: FHR baseline 140s/moderate variability/+ accels, 2 variables that have since resolved ? ?CVE: Dilation: 6 ?Effacement (%): 90 ?Station: -1, 0 ?Presentation: Vertex ?Exam by:: Glenice Bow, rnc ? ? ?A&P: 31 y.o. [id number] [redacted]w[redacted]d  ?#Labor: Progressing, currently on pitocin will continue to uptitrate. Can consider AROM at next check ?#Pain: Epidural in place ?#FWB: 2 variables that have since resolved, overall reassuring will continue to monitor ?#GBS positive ? ?#gHTN ?BP normotensive most recently. Does have dark spots in vision similar to this morning. Patient told nursing that she had a headache. Will obtain another set of preE labs. ?-serial BPs ?-monitor vision changes ?-monitor if headache resolved after tylenol ? ?Gerrit Heck, [place] for [employer], [place] ?10:08 PM  ?

## 2022-01-18 ENCOUNTER — Encounter (HOSPITAL_COMMUNITY): Payer: Self-pay | Admitting: Family Medicine

## 2022-01-18 LAB — RPR: RPR Ser Ql: NONREACTIVE

## 2022-01-18 MED ORDER — WITCH HAZEL-GLYCERIN EX PADS: 1.0000 "application " | MEDICATED_PAD | CUTANEOUS | Status: DC | PRN

## 2022-01-18 MED ORDER — MEDROXYPROGESTERONE ACETATE 150 MG/ML IM SUSP: 150.0000 mg | INTRAMUSCULAR | Status: DC | PRN

## 2022-01-18 MED ORDER — SENNOSIDES-DOCUSATE SODIUM 8.6-50 MG PO TABS
2.0000 | ORAL_TABLET | Freq: Every day | ORAL | Status: DC
  Filled 2022-01-18: qty 2

## 2022-01-18 MED ORDER — PRENATAL MULTIVITAMIN CH
1.0000 | ORAL_TABLET | Freq: Every day | ORAL | Status: DC
  Administered 2022-01-18: 1 via ORAL
  Filled 2022-01-18 (×2): qty 1

## 2022-01-18 MED ORDER — BENZOCAINE-MENTHOL 20-0.5 % EX AERO
1.0000 "application " | INHALATION_SPRAY | CUTANEOUS | Status: DC | PRN
  Administered 2022-01-18: 1 via TOPICAL
  Filled 2022-01-18: qty 56

## 2022-01-18 MED ORDER — MISOPROSTOL 200 MCG PO TABS
ORAL_TABLET | ORAL | Status: AC
  Filled 2022-01-18: qty 5

## 2022-01-18 MED ORDER — IBUPROFEN 600 MG PO TABS
600.0000 mg | ORAL_TABLET | Freq: Four times a day (QID) | ORAL | Status: DC
  Administered 2022-01-18 – 2022-01-19 (×5): 600 mg via ORAL
  Filled 2022-01-18 (×6): qty 1

## 2022-01-18 MED ORDER — ONDANSETRON HCL 4 MG/2ML IJ SOLN: 4.0000 mg | INTRAMUSCULAR | Status: DC | PRN

## 2022-01-18 MED ORDER — DIPHENHYDRAMINE HCL 25 MG PO CAPS
25.0000 mg | ORAL_CAPSULE | Freq: Four times a day (QID) | ORAL | Status: DC | PRN
Start: 2022-01-18 — End: 2022-01-19

## 2022-01-18 MED ORDER — CLINDAMYCIN PHOSPHATE 900 MG/50ML IV SOLN
900.0000 mg | Freq: Three times a day (TID) | INTRAVENOUS | Status: AC
  Administered 2022-01-18 – 2022-01-19 (×4): 900 mg via INTRAVENOUS
  Filled 2022-01-18 (×5): qty 50

## 2022-01-18 MED ORDER — FUROSEMIDE 20 MG PO TABS
20.0000 mg | ORAL_TABLET | Freq: Every day | ORAL | Status: DC
  Administered 2022-01-18: 20 mg via ORAL
  Filled 2022-01-18 (×4): qty 1

## 2022-01-18 MED ORDER — NIFEDIPINE ER OSMOTIC RELEASE 30 MG PO TB24
30.0000 mg | ORAL_TABLET | Freq: Every day | ORAL | Status: DC
  Filled 2022-01-18: qty 1

## 2022-01-18 MED ORDER — ACETAMINOPHEN 325 MG PO TABS
650.0000 mg | ORAL_TABLET | ORAL | Status: DC | PRN
  Administered 2022-01-18 – 2022-01-19 (×2): 650 mg via ORAL
  Filled 2022-01-18 (×2): qty 2

## 2022-01-18 MED ORDER — IBUPROFEN 600 MG PO TABS
600.0000 mg | ORAL_TABLET | Freq: Once | ORAL | Status: AC
  Administered 2022-01-18: 600 mg via ORAL
  Filled 2022-01-18: qty 1

## 2022-01-18 MED ORDER — ONDANSETRON HCL 4 MG PO TABS: 4.0000 mg | ORAL_TABLET | ORAL | Status: DC | PRN

## 2022-01-18 MED ORDER — GENTAMICIN SULFATE 40 MG/ML IJ SOLN
5.0000 mg/kg | Freq: Once | INTRAVENOUS | Status: AC
  Administered 2022-01-18: 390 mg via INTRAVENOUS
  Filled 2022-01-18: qty 9.75

## 2022-01-18 MED ORDER — COCONUT OIL OIL: 1.0000 "application " | TOPICAL_OIL | Status: DC | PRN

## 2022-01-18 MED ORDER — TETANUS-DIPHTH-ACELL PERTUSSIS 5-2.5-18.5 LF-MCG/0.5 IM SUSY: 0.5000 mL | PREFILLED_SYRINGE | Freq: Once | INTRAMUSCULAR | Status: DC

## 2022-01-18 MED ORDER — ZOLPIDEM TARTRATE 5 MG PO TABS: 5.0000 mg | ORAL_TABLET | Freq: Every evening | ORAL | Status: DC | PRN

## 2022-01-18 MED ORDER — GENTAMICIN SULFATE 40 MG/ML IJ SOLN: 1.5000 mg/kg | Freq: Three times a day (TID) | INTRAVENOUS | Status: DC

## 2022-01-18 MED ORDER — MISOPROSTOL 200 MCG PO TABS
1000.0000 ug | ORAL_TABLET | Freq: Once | ORAL | Status: AC
  Administered 2022-01-18: 1000 ug via RECTAL

## 2022-01-18 MED ORDER — DIBUCAINE (PERIANAL) 1 % EX OINT: 1.0000 "application " | TOPICAL_OINTMENT | CUTANEOUS | Status: DC | PRN

## 2022-01-18 MED ORDER — SIMETHICONE 80 MG PO CHEW: 80.0000 mg | CHEWABLE_TABLET | ORAL | Status: DC | PRN

## 2022-01-18 NOTE — Progress Notes (Signed)
Call placed to MD, spoke with Dr Gwenlyn Perking , B/P's 134/88, 122/91 and repeat 130/86 this am, reflexes 2+/0 clonus, denies H/A but has floaters in eyes, "feels like fuzziness in my eyes," patient states.  Pain level also 6-7 and medicated x 2. Order to call MD with next B/P check at 1530 received ?

## 2022-01-18 NOTE — Plan of Care (Signed)
?  Problem: Education: ?Goal: Knowledge of Childbirth will improve ?Outcome: Adequate for Discharge ?Goal: Ability to make informed decisions regarding treatment and plan of care will improve ?Outcome: Adequate for Discharge ?Goal: Ability to state and carry out methods to decrease the pain will improve ?Outcome: Adequate for Discharge ?Goal: Individualized Educational Video(s) ?Outcome: Not Applicable ?  ?Problem: Coping: ?Goal: Ability to verbalize concerns and feelings about labor and delivery will improve ?Outcome: Adequate for Discharge ?  ?Problem: Life Cycle: ?Goal: Ability to make normal progression through stages of labor will improve ?Outcome: Completed/Met ?Goal: Ability to effectively push during vaginal delivery will improve ?Outcome: Completed/Met ?  ?Problem: Role Relationship: ?Goal: Will demonstrate positive interactions with the child ?Outcome: Adequate for Discharge ?  ?Problem: Safety: ?Goal: Risk of complications during labor and delivery will decrease ?Outcome: Completed/Met ?  ?Problem: Pain Management: ?Goal: Relief or control of pain from uterine contractions will improve ?Outcome: Completed/Met ?  ?

## 2022-01-18 NOTE — Progress Notes (Addendum)
POSTPARTUM PROGRESS NOTE ? ?Post Partum Day 1 ? ?Subjective: ? ?Shelly Reed is a 31 y.o. [phone] s/p SVD at [redacted]w[redacted]d.  She reports she is doing well. She denies any problems with ambulating, voiding or po intake. Denies nausea or vomiting.  Pain is well controlled.  Lochia is appropriate. ? ?Objective: ?Blood pressure 134/88, pulse 91, temperature 98.8 ?F (37.1 ?C), temperature source Oral, resp. rate 16, height 5' (1.524 m), weight 78.7 kg, last menstrual period 04/17/2021, SpO2 98 %, unknown if currently breastfeeding. ? ?Physical Exam:  ?General: alert, cooperative and no distress ?Chest: no respiratory distress ?Heart:regular rate ?Abdomen: soft, nontender,  ?Uterine Fundus: firm, appropriately tender ?DVT Evaluation: No calf swelling or tenderness ?Extremities: no lower extremity edema ?Skin: warm, dry ? ?Recent Labs  ?  01/17/22 ?1459 01/17/22 ?2153  ?HGB 10.8* 10.7*  ?HCT 33.9* 34.7*  ? ? ?Assessment/Plan: ?Shelly Reed is a 31 y.o. [phone] s/p SVD at [redacted]w[redacted]d  ? ?PPD#1 - Doing well ? Routine postpartum care ?Contraception: POPs ?Feeding: Breast and bottle ?Dispo: Plan for discharge tomorrow since late evening delivery ? ?#PP Endometritis ?T elevated to 102.9>102.7 after tylenol postpartum. gentamicin and clindamycin given last night. Temps have been appropriate this morning at 98 ?-Monitor fever curve ?-Continue Abx for 24 hours after last fever (timed out at 0330 AM on 3/19) ? ?#PreE w/o SF ?P/C 0.4 from catheterized urine last night. Patient denies any headache and says her "black spots in her vision" are present but have improved greatly since yesterday. Team discussed in detail during labor and not treated as severe feature previously. BP has most recently been 134/88 this morning.  ?-Lasix  ?-Meds2Bed ?-Monitor BP ? ? ? LOS: 1 day  ? ?Levin Erp, MD  ?FM PGY-1 ?01/18/2022, 8:57 AM  ? ?GME ATTESTATION:  ?I saw and evaluated the patient. I agree with the findings and the plan of  care as documented in the resident?s note and have made all necessary edits.  ? ?Warner Mccreedy, MD, MPH ?OB Fellow, Faculty Practice ?[place], Center for [facility] ?01/18/2022 9:50 AM ? ?

## 2022-01-18 NOTE — Progress Notes (Signed)
Patient assisted to BR to void with  steady and night shift nurse Joann, unable to void , fundus firm U/U, moderate rubra,. Bladder scanned for . Patient encouraged to drink PO fluids  ?

## 2022-01-18 NOTE — Progress Notes (Signed)
B/P 123/86 and floaters getting better per patient called to Dr Mathis Fare. MD states to recheck and call at 8pm  ?

## 2022-01-18 NOTE — Lactation Note (Signed)
This note was copied from a baby's chart. ?Lactation Consultation Note ? ?Patient Name: Shelly Reed ?Today's Date: 01/18/2022 ?  ?Age:29 hours ? Per RN Beverlee Nims) in L&D, mom declined latch assistance in L&D and on MBU.  ?Maternal Data ?  ? ?Feeding ?  ? ?LATCH Score ? ? ? ? ? ? ? ? ? ? ? ? ? ?Lactation Tools Discussed/Used ?  ? ?Interventions ?  ? ?Discharge ?  ? ?Consult Status ?  ? ? ? ?Vicente Serene ?01/18/2022, 2:45 AM ? ? ? ?

## 2022-01-18 NOTE — Anesthesia Postprocedure Evaluation (Signed)
Anesthesia Post Note ? ?Patient: Shelly Reed ? ?Procedure(s) Performed: AN AD HOC LABOR EPIDURAL ? ?  ? ?Patient location during evaluation: Mother Baby ?Anesthesia Type: Epidural ?Level of consciousness: awake and alert and oriented ?Pain management: satisfactory to patient ?Vital Signs Assessment: post-procedure vital signs reviewed and stable ?Respiratory status: respiratory function stable ?Cardiovascular status: stable ?Postop Assessment: no headache, no backache, epidural receding, patient able to bend at knees, no signs of nausea or vomiting, adequate PO intake and able to ambulate ?Anesthetic complications: no ? ? ?No notable events documented. ? ?Last Vitals:  ?Vitals:  ? 01/18/22 0555 01/18/22 0731  ?BP:  134/88  ?Pulse:  91  ?Resp:  16  ?Temp: 37.1 ?C 37.1 ?C  ?SpO2:  98%  ?  ?Last Pain:  ?Vitals:  ? 01/18/22 0731  ?TempSrc: Oral  ?PainSc:   ? ?Pain Goal:   ? ?  ?  ?  ?  ?  ?  ?  ? ?Shay Jhaveri ? ? ? ? ?

## 2022-01-18 NOTE — Discharge Summary (Signed)
? ?  Postpartum Discharge Summary ? ?   ?Patient Name: Shelly Reed ?DOB: Aug 19, 1991 ?MRN: [id number] ? ?Date of admission: 01/17/2022 ?Delivery date:01/17/2022  ?Delivering provider: Renard Matter  ?Date of discharge: 01/19/2022 ? ?Admitting diagnosis: Gestational hypertension [O13.9] ?Intrauterine pregnancy: [redacted]w[redacted]d     ?Secondary diagnosis:  Principal Problem: ?  Vaginal delivery ?Active Problems: ?  History of chlamydia infection ?  Pre-eclampsia ?  Endometritis ? ?Additional problems: None    ?Discharge diagnosis: Term Pregnancy Delivered and Preeclampsia (mild)                                              ?Post partum procedures: None ?Augmentation: Pitocin ?Complications: None ? ?Hospital course: Induction of Labor With Vaginal Delivery   ?31 y.o. yo O8N8676 at [redacted]w[redacted]d was admitted to the hospital 01/17/2022 for induction of labor.  Indication for induction: Gestational hypertension.  Patient had an uncomplicated labor course and progressed to complete with Pitocin. Of note, her P:C ratio was 0.4, meeting criteria for pre-eclampsia without severe features. She had an uncomplicated vaginal delivery.  ?Membrane Rupture Time/Date: 10:25 PM ,01/17/2022   ?Delivery Method:Vaginal, Spontaneous  ?Episiotomy: None  ?Lacerations:  Periurethral  ?Details of delivery can be found in separate delivery note.  Patient had a routine postpartum course.  She was started on Lasix 20 mg and Procardia 30 mg postpartum for blood pressure control postpartum with good result. She will continue Lasix for a total of 5 days. She also received Gentamycin and Clindamycin for postpartum endometritis. She remained afebrile after treatment without complication. She is eating, drinking, ambulating, and voiding without issue. She will have a BP check in one week at [facility] office with a postpartum visit in 6 weeks at [facility]. Return precautions reviewed with patient. Patient is discharged home 01/19/22. ? ?Newborn Data: ?Birth date:01/17/2022  ?Birth  time:11:45 PM  ?Gender:Female  ?Living status:Living  ?Apgars:8 ,9  ?Weight:3380 g  ? ?Magnesium Sulfate received: No ?BMZ received: No ?Rhophylac: N/A ?MMR: N/A ?T-DaP: Given prenatally ?Flu: No - declined ?Transfusion: No ? ?Physical exam  ?Vitals:  ? 01/18/22 2000 01/19/22 7209 01/19/22 4709 01/19/22 0909  ?BP: 125/85 124/87 (!) 123/94 118/87  ?Pulse: 75 66 68 89  ?Resp: $Remov'18 18  17  'EXqePv$ ?Temp: 98.3 ?F (36.8 ?C) 98.2 ?F (36.8 ?C)  97.9 ?F (36.6 ?C)  ?TempSrc: Oral Oral  Oral  ?SpO2: 98% 98%    ?Weight:      ?Height:      ? ?General: alert, cooperative, and no distress ?Lochia: appropriate ?Uterine Fundus: firm and below umbilicus  ?DVT Evaluation: no LE edema or calf tenderness to palpation ? ?Labs: ?Lab Results  ?Component Value Date  ? WBC 8.5 01/17/2022  ? HGB 10.7 (L) 01/17/2022  ? HCT 34.7 (L) 01/17/2022  ? MCV 84.4 01/17/2022  ? PLT 184 01/17/2022  ? ?CMP Latest Ref Rng & Units 01/17/2022  ?Glucose 70 - 99 mg/dL 82  ?BUN 6 - 20 mg/dL 8  ?Creatinine 0.44 - 1.00 mg/dL 0.56  ?Sodium 135 - 145 mmol/L 137  ?Potassium 3.5 - 5.1 mmol/L 3.8  ?Chloride 98 - 111 mmol/L 108  ?CO2 22 - 32 mmol/L 21(L)  ?Calcium 8.9 - 10.3 mg/dL 8.4(L)  ?Total Protein 6.5 - 8.1 g/dL 5.5(L)  ?Total Bilirubin 0.3 - 1.2 mg/dL 1.2  ?Alkaline Phos 38 - 126 U/L 220(H)  ?AST 15 -  41 U/L 23  ?ALT 0 - 44 U/L 19  ? ?Edinburgh Score: ?Edinburgh Postnatal Depression Scale Screening Tool 01/18/2020  ?I have been able to laugh and see the funny side of things. 0  ?I have looked forward with enjoyment to things. 0  ?I have blamed myself unnecessarily when things went wrong. 0  ?I have been anxious or worried for no good reason. 0  ?I have felt scared or panicky for no good reason. 2  ?Things have been getting on top of me. 1  ?I have been so unhappy that I have had difficulty sleeping. 0  ?I have felt sad or miserable. 0  ?I have been so unhappy that I have been crying. 0  ?The thought of harming myself has occurred to me. 0  ?Edinburgh Postnatal Depression  Scale Total 3  ? ? ? ?After visit meds:  ?Allergies as of 01/19/2022   ?No Known Allergies ?  ? ?  ?Medication List  ?  ? ?STOP taking these medications   ? ?diphenhydrAMINE 25 MG tablet ?Commonly known as: BENADRYL ?  ?promethazine 25 MG tablet ?Commonly known as: PHENERGAN ?  ?terconazole 0.4 % vaginal cream ?Commonly known as: TERAZOL 7 ?  ? ?  ? ?TAKE these medications   ? ?acetaminophen 500 MG tablet ?Commonly known as: TYLENOL ?Take 2 tablets (1,000 mg total) by mouth every 8 (eight) hours as needed (pain). ?What changed:  ?when to take this ?reasons to take this ?  ?furosemide 20 MG tablet ?Commonly known as: LASIX ?Take 1 tablet (20 mg total) by mouth daily for 3 days. ?  ?ibuprofen 600 MG tablet ?Commonly known as: ADVIL ?Take 1 tablet (600 mg total) by mouth every 6 (six) hours as needed (pain). ?  ?NIFEdipine 30 MG 24 hr tablet ?Commonly known as: ADALAT CC ?Take 1 tablet (30 mg total) by mouth daily. ?  ?norethindrone 0.35 MG tablet ?Commonly known as: MICRONOR ?Take 1 tablet (0.35 mg total) by mouth daily. For birth control. ?  ?prenatal multivitamin Tabs tablet ?Take 1 tablet by mouth daily at 12 noon. ?  ? ?  ? ? ? ?Discharge home in stable condition ?Infant Feeding: Bottle and Breast ?Infant Disposition: home with mother ?Discharge instruction: per After Visit Summary and Postpartum booklet. ?Activity: Advance as tolerated. Pelvic rest for 6 weeks.  ?Diet: routine diet ? ?Follow up Visit: ? Follow-up Information   ? ? Center for [employer] at [facility]. Go in 1 week(s).   ?Specialty: Obstetrics and Gynecology ?Why: For blood pressure check. They will call you to set up an appointment time. ?Contact information: ?[address] ?[place] [address] ?[phone] ? ?  ?  ? ?  ?  ? ?  ? ?Patient to call and make appt at [facility] for 6 week postpartum visit.  ?Message sent to [facility] for BP check in one week.  ? ?In person postpartum visit in 6 weeks with the  following provider: [facility] ?Additional Postpartum F/U: BP check 1 week at [facility] ?High risk pregnancy complicated by:  Pre-E without SF ?Delivery mode:  Vaginal, Spontaneous  ?Anticipated Birth Control:   POPs ? ?01/19/2022 ?Genia Del, MD ? ? ? ?
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
## 2022-01-19 DIAGNOSIS — N719 Inflammatory disease of uterus, unspecified: Secondary | ICD-10-CM | POA: Diagnosis not present

## 2022-01-19 MED ORDER — FUROSEMIDE 20 MG PO TABS: 20.0000 mg | ORAL_TABLET | Freq: Every day | ORAL | 0 refills | Status: DC

## 2022-01-19 MED ORDER — NIFEDIPINE ER 30 MG PO TB24: 30.0000 mg | ORAL_TABLET | Freq: Every day | ORAL | 0 refills | Status: DC

## 2022-01-19 MED ORDER — IBUPROFEN 600 MG PO TABS: 600.0000 mg | ORAL_TABLET | Freq: Four times a day (QID) | ORAL | 0 refills | Status: DC | PRN

## 2022-01-19 MED ORDER — ACETAMINOPHEN 500 MG PO TABS: 1000.0000 mg | ORAL_TABLET | Freq: Three times a day (TID) | ORAL | 0 refills | Status: DC | PRN

## 2022-01-19 MED ORDER — NORETHINDRONE 0.35 MG PO TABS: 1.0000 | ORAL_TABLET | Freq: Every day | ORAL | 5 refills | Status: DC
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
## 2022-01-27 ENCOUNTER — Telehealth: Payer: Self-pay

## 2022-01-27 ENCOUNTER — Telehealth (HOSPITAL_COMMUNITY): Payer: Self-pay | Admitting: *Deleted

## 2022-01-27 ENCOUNTER — Ambulatory Visit: Payer: Self-pay

## 2022-01-27 NOTE — Telephone Encounter (Signed)
Called pt with interpreter Raquel to follow up on missed appt today for BP check. Appt rescheduled for tomorrow, 01/28/22 at 1500. ? ?

## 2022-01-27 NOTE — Telephone Encounter (Signed)
Discharge follow-up call completed with Language [address] [place] #[id number]. ?Patient voiced no questions or concerns regarding her own health at this time. EPDS=0. Patient voiced no questions or concerns regarding infant at this time. Patient reports infant sleeps in a crib on her back. RN reviewed ABCs of safe sleep. Patient verbalized understanding. Deforest Hoyles, RN, 01/27/22, [phone] ?

## 2022-01-28 ENCOUNTER — Ambulatory Visit (INDEPENDENT_AMBULATORY_CARE_PROVIDER_SITE_OTHER): Payer: Self-pay

## 2022-01-28 ENCOUNTER — Other Ambulatory Visit: Payer: Self-pay

## 2022-01-28 VITALS — BP 127/86 | HR 79 | Wt 147.1 lb

## 2022-01-28 DIAGNOSIS — Z0130 Encounter for examination of blood pressure without abnormal findings: Secondary | ICD-10-CM

## 2022-01-28 NOTE — Progress Notes (Signed)
Blood Pressure Check Visit ? ?Shelly Reed is here for blood pressure check following induced vaginal delivery on 01/18/22. BP today is 127/86. Patient denies any dizziness, blurred vision, headache, shortness of breath, peripheral edema. Patient takes 30 mg Nifedipine daily in the evenings. Per patient her most recent dose was last night. I reviewed signs and symptoms of pre-eclampsia with patient. Patient states her postpartum appointment is scheduled with the [facility]. Patient denies any other questions or concerns.  ? ?Cline Crock, RN ?01/28/2022   ?

## 2022-07-10 ENCOUNTER — Encounter: Payer: Self-pay | Admitting: Obstetrics and Gynecology

## 2022-07-10 ENCOUNTER — Ambulatory Visit: Payer: Self-pay | Admitting: *Deleted

## 2022-07-10 ENCOUNTER — Ambulatory Visit (INDEPENDENT_AMBULATORY_CARE_PROVIDER_SITE_OTHER): Payer: Self-pay | Admitting: Obstetrics and Gynecology

## 2022-07-10 ENCOUNTER — Other Ambulatory Visit (HOSPITAL_COMMUNITY)
Admission: RE | Admit: 2022-07-10 | Discharge: 2022-07-10 | Disposition: A | Discharge status: Home / Self Care | Payer: Self-pay | Source: Ambulatory Visit | Attending: Obstetrics and Gynecology | Admitting: Obstetrics and Gynecology

## 2022-07-10 VITALS — BP 115/76 | Wt 155.2 lb

## 2022-07-10 DIAGNOSIS — R87610 Atypical squamous cells of undetermined significance on cytologic smear of cervix (ASC-US): Secondary | ICD-10-CM

## 2022-07-10 DIAGNOSIS — R87810 Cervical high risk human papillomavirus (HPV) DNA test positive: Secondary | ICD-10-CM

## 2022-07-10 DIAGNOSIS — Z1239 Encounter for other screening for malignant neoplasm of breast: Secondary | ICD-10-CM

## 2022-07-10 DIAGNOSIS — Z3202 Encounter for pregnancy test, result negative: Secondary | ICD-10-CM

## 2022-07-10 LAB — POCT PREGNANCY, URINE: Preg Test, Ur: NEGATIVE

## 2022-07-10 NOTE — Progress Notes (Signed)
    GYNECOLOGY CLINIC COLPOSCOPY PROCEDURE NOTE  31 y.o. [id number] here for colposcopy for ASCUS with POSITIVE high risk HPV pap smear on 4/23. Discussed role for HPV in cervical dysplasia, need for surveillance.  Patient given informed consent, signed copy in the chart, time out was performed.  Placed in lithotomy position. Cervix viewed with speculum and colposcope after application of acetic acid.   Colposcopy adequate? Yes  acetowhite lesion(s) noted at 12 & 6 o'clock; corresponding biopsies obtained.  ECC specimen obtained. Monsel's applied All specimens were labelled and sent to pathology.  Patient was given post procedure instructions.  Will follow up pathology and manage accordingly.  Routine preventative health maintenance measures emphasized.    Nettie Elm MD, FACOG Attending Obstetrician & Gynecologist Center for [facility], [facility]

## 2022-07-10 NOTE — Progress Notes (Signed)
[name] is a 31 y.o. female who presents to [facility] clinic today with no complaints. Patient referred to BCCCP by the [facility] due to having an abnormal Pap smear 02/19/2022 that a colposcopy is recommended for follow up.   Pap Smear: Pap smear not completed today. Last Pap smear was 02/19/2022 at the [facility] clinic and was abnormal - ASCUS with positive HPV . Per patient has no history of an abnormal Pap smear prior to her most recent Pap smear. Last Pap smear result is available in Epic.   Physical exam: Breasts Breasts symmetrical. No skin abnormalities bilateral breasts. No nipple retraction bilateral breasts. Patient stopped breast feeding 1-2 weeks ago. Breast milk expressed on exam. No lymphadenopathy. No lumps palpated bilateral breasts. No complaints of pain or tenderness on exam. Screening mammogram recommended at age 41 unless clinically indicated prior.    Pelvic/Bimanual Pap is not indicated today per BCCCP guidelines.   Smoking History: Patient has never smoked.   Patient Navigation: Patient education provided. Access to services provided for patient through [facility] program. Spanish interpreter Natale Lay from [facility] provided.    Breast and Cervical Cancer Risk Assessment: Patient does not have family history of breast cancer, known genetic mutations, or radiation treatment to the chest before age 22. Patient does not have history of cervical dysplasia, immunocompromised, or DES exposure in-utero. Breast cancer risk assessment completed. No breast cancer risk calculated due to patient is less than 17 years old.   Risk Assessment     Risk Scores       07/10/2022   Last edited by: Meryl Dare, CMA   5-year risk:    Lifetime risk:             A: BCCCP exam without pap smear No complaints.  P: Referred patient to the [facility] for [facility] for a colposcopy to follow up for  her abnormal Pap smear. Appointment scheduled Thursday, July 10, 2022 at 1335.  Priscille Heidelberg, RN 07/10/2022 12:38 PM

## 2022-07-10 NOTE — Patient Instructions (Signed)
Colposcopa, cuidados posteriores Colposcopy, Care After  La siguiente informacin ofrece orientacin sobre cmo cuidarse despus del procedimiento. El mdico tambin podr darle indicaciones ms especficas. Si tiene problemas o preguntas, llame al mdico. Qu puedo esperar despus del procedimiento? Si no le han tomado una muestra de tejido (no le realizaron una biopsia), es posible que solo presente unas manchas de sangre durante algunos das. Puede retomar sus actividades normales. Si le tomaron una muestra de tejido, es frecuente que tenga lo siguiente: Sensibilidad y dolor leve. Esto puede durar algunos das. Una poca cantidad de sangrado o lquido (secrecin) que salen de la vagina. El lquido se ver oscuro y granulado. Puede tener esto durante algunos das. El lquido puede deberse a un lquido que se us durante el procedimiento. Es posible que deba usar un apsito sanitario. Manchas de sangre durante al menos 48 horas despus del procedimiento. Siga estas instrucciones en su casa: Medicamentos Use los medicamentos de venta libre y los recetados solamente como se lo haya indicado el mdico. Pregntele al mdico qu analgsicos de venta libre y recetados puede comenzar a tomar nuevamente. Esto es muy importante si toma anticoagulantes. Actividad Por al menos 3 das, o el tiempo que le haya indicado el mdico, evite lo siguiente: Las duchas vaginales. Los tampones. Tener sexo. Retome sus actividades normales como se lo haya indicado el mdico. Pregntele al mdico qu actividades son seguras para usted. Instrucciones generales Pregunte al mdico si puede tomar baos de inmersin, nadar o usar el jacuzzi. Puede ducharse. Si usa un mtodo anticonceptivo, contine usndolo. Concurra a todas las visitas de seguimiento. Comunquese con un mdico si: Tiene fiebre o escalofros. Se desmaya o tiene sensacin de desvanecimiento. Solicite ayuda de inmediato si: Tiene mucho sangrado de la  vagina. Mucho sangrado significa que el sangrado empapa una toalla higinica en menos de 1 hora. Elimina grumos de sangre (cogulos de sangre) por la vagina. Tiene signos que podran indicar que tiene una infeccin. El lquido proveniente de la vagina es: Diferente de lo normal. [place]. Tiene mal olor. Tiene mucho dolor o clicos en la parte baja del vientre que no se alivian con medicamentos. Resumen Si no le han tomado una muestra de tejido, es posible que solo presente unas manchas de sangre durante algunos das. Puede retomar sus actividades normales. Si le tomaron una muestra de tejido, es comn tener dolor leve durante algunos das y manchado de sangre durante 48 horas. Evite hacerse duchas vaginales, usar tampones y tener sexo durante al menos 3 das despus del procedimiento o durante el tiempo que le hayan indicado. Busque ayuda de inmediato si presenta mucho sangrado, mucho dolor intenso o signos de infeccin. Esta informacin no tiene como fin reemplazar el consejo del mdico. Asegrese de hacerle al mdico cualquier pregunta que tenga. Document Revised: 04/09/2021 Document Reviewed: 04/09/2021 Elsevier Patient Education  2023 Elsevier Inc.  

## 2022-07-10 NOTE — Patient Instructions (Signed)
Explained breast self awareness with Roberto Scales. Patient did not need a Pap smear today due to last Pap smear was 02/19/2022. Explained the colposcopy the recommended follow up for her abnormal Pap smear. Referred patient to the [facility] for [facility] for a colposcopy to follow up for her abnormal Pap smear. Appointment scheduled Thursday, July 10, 2022 at 1335. Patient aware of appointment and will be there. Let patient know a screening mammogram is recommended at age 88 unless clinically indicated prior. Roberto Scales verbalized understanding.  Delle Andrzejewski, Kathaleen Maser, RN 12:39 PM

## 2022-07-15 LAB — SURGICAL PATHOLOGY

## 2022-07-27 NOTE — ED Notes (Signed)
 Patient back from ULT dept. Per stretcher stable

## 2022-07-27 NOTE — ED Provider Notes (Signed)
 Attending Supervisory Attestation Note  I have personally seen and examined the patient, and discussed the plan of care with the resident. I have reviewed the Labs and Imaging and have incorporated these into my medical decision making. I have reviewed the documentation of the resident and agree with the documented findings and plan of care.   In summary, this is a 31 y.o. year old female with no significant past medical history who presents to emergency department with heavy vaginal bleeding.  Patient notes that this is her second.  After having a child approximately 6 months ago, first.  Was heavier, however closer to her normal.  Has been spotting over the last month, bleeding today soaking through 3 pads an hour, has gone through at least 12 pads today.  No associated lightheadedness, dizziness, shortness of breath.  No abdominal pain.  Patient does note that she was being worked up for possible vaginal or cervical cancer prior to her pregnancy at home previous GYN evaluation.  Has not as she has established with a GYN in the area.  On exam patient is well-appearing, no acute distress. Abdomen soft, nontender, nondistended, no rebound, guarding.  Exam with pooling in the vaginal vault, unable to visualize cervix.   Ddx: pregnancy (IUP, SAB (threatened, inevitable, missed), ectopic), trauma, dysfunctional uterine bleeding, renal colic, UTI/Pyelo, STI  Doubt pregnancy, negative pregnancy, regular cycles, not sexually active. Doubt trauma, no hx of, no signs on exam. Less likely STI given no vaginal d/c hx of prior. Consider UTI/pyelo vs renal colic and hematuria. C/f dysfunctional uterine bleeding.   Plan: CBC, CMP, pregnancy test, UA  Labs reassuring, pregnancy negative, UA without evidence of infection.  Given large amount of bleeding and history of possible mass lesion plan for GYN consultation.   Recommended transvaginal ultrasound and then will see patient.  Plan for transvaginal ultrasound,  signed out pending transvaginal ultrasound results and GYN consultation.     I was present for the following procedures: None Time Spent in Critical Care of the patient: None Patient's presentation is most consistent with acute presentation with potential threat to life or bodily function.   Disposition: Signed out pending GYN recommendations   Electronically signed by: Nat Cy Favors, MD 07/27/2022 10:46 PM    Electronically signed by: Favors Nat Cy, MD 07/29/22 [phone]

## 2022-07-30 ENCOUNTER — Telehealth: Payer: Self-pay | Admitting: General Practice

## 2022-07-30 NOTE — Telephone Encounter (Signed)
-----   Message from Chancy Milroy, MD sent at 07/30/2022  3:01 PM EDT ----- Please let pt know that her colpo Bx revealed LGSIL. I recommend repeat pap smear in 1 yr.  Thanks Legrand Como

## 2022-07-30 NOTE — Telephone Encounter (Signed)
Called patient with Shelly Reed assisting with spanish interpretation and informed her of results. Discussed follow up recommendations with patient. Patient verbalized understanding and reports recent visit to ER for heavy bleeding. Per chart review, patient was seen for heavy bleeding on her period and given provera Rx. Patient states she is still bleeding but has lessened. Told patient she doesn't need to follow up with [place] unless bleeding worsens or doesn't stop. Patient verbalized understanding.

## 2022-11-03 NOTE — L&D Delivery Note (Signed)
OB/GYN Faculty Practice Delivery Note  Shelly Reed is a 32 y.o. [id number] s/p SVD at [redacted]w[redacted]d. She was admitted for IOL 2/2 GHTN.   ROM: 3h 29m with clear fluid GBS Status:  Positive/-- (06/12 0000) Maximum Maternal Temperature: 98.23F  Labor Progress: Initial SVE: 3.5/80/-2. She then progressed to complete.   Delivery Date/Time: 0602 11/28 Delivery: Called to room and patient was complete and pushing. Head delivered ROA. Nuchal cord present. Shoulder and body delivered in usual fashion. Infant with spontaneous cry, placed on mother's abdomen, dried and stimulated. Nuchal easily reduced. Cord clamped x 2 after 1-minute delay, and cut by FOB. Cord blood drawn. Placenta delivered spontaneously with gentle cord traction. Fundus firm with massage, Cytotec 1g, full fundal sweep and Pitocin. Labia, perineum, vagina, and cervix inspected without laceration. Mom and baby doing well.  Baby Weight: pending  Placenta: 3 vessel, intact. Sent to L&D Complications: None Lacerations: none EBL: 470 mL Analgesia: Epidural   Infant:  APGAR (1 MIN): 8  APGAR (5 MINS): 9   Hessie Dibble, MD [facility] Fellow, [facility] for [facility], [facility] 10/01/2023, 6:19 AM

## 2022-11-19 NOTE — Progress Notes (Signed)
 I chaperoned provider for exam.Electronically signed by: Barnie HILARIO Danker, CNA 11/19/2022 4:33 PM     Electronically signed by: Rosine Curtis Scarpa, NP 11/20/22 1255

## 2023-03-29 ENCOUNTER — Inpatient Hospital Stay (HOSPITAL_COMMUNITY)
Admission: AD | Admit: 2023-03-29 | Discharge: 2023-03-29 | Disposition: A | Discharge status: Home / Self Care | Payer: Self-pay | Attending: Obstetrics & Gynecology | Admitting: Obstetrics & Gynecology

## 2023-03-29 ENCOUNTER — Other Ambulatory Visit: Payer: Self-pay

## 2023-03-29 ENCOUNTER — Inpatient Hospital Stay (HOSPITAL_COMMUNITY): Payer: Self-pay

## 2023-03-29 ENCOUNTER — Encounter (HOSPITAL_COMMUNITY): Payer: Self-pay | Admitting: Obstetrics & Gynecology

## 2023-03-29 DIAGNOSIS — Z3A13 13 weeks gestation of pregnancy: Secondary | ICD-10-CM | POA: Insufficient documentation

## 2023-03-29 DIAGNOSIS — O09291 Supervision of pregnancy with other poor reproductive or obstetric history, first trimester: Secondary | ICD-10-CM | POA: Insufficient documentation

## 2023-03-29 DIAGNOSIS — O219 Vomiting of pregnancy, unspecified: Secondary | ICD-10-CM | POA: Insufficient documentation

## 2023-03-29 DIAGNOSIS — R109 Unspecified abdominal pain: Secondary | ICD-10-CM

## 2023-03-29 DIAGNOSIS — R1084 Generalized abdominal pain: Secondary | ICD-10-CM | POA: Insufficient documentation

## 2023-03-29 DIAGNOSIS — O26891 Other specified pregnancy related conditions, first trimester: Secondary | ICD-10-CM | POA: Insufficient documentation

## 2023-03-29 DIAGNOSIS — O0931 Supervision of pregnancy with insufficient antenatal care, first trimester: Secondary | ICD-10-CM | POA: Insufficient documentation

## 2023-03-29 DIAGNOSIS — O26899 Other specified pregnancy related conditions, unspecified trimester: Secondary | ICD-10-CM

## 2023-03-29 LAB — URINALYSIS, ROUTINE W REFLEX MICROSCOPIC
Bilirubin Urine: NEGATIVE
Glucose, UA: NEGATIVE mg/dL
Ketones, ur: NEGATIVE mg/dL
Leukocytes,Ua: NEGATIVE
Nitrite: NEGATIVE
Protein, ur: NEGATIVE mg/dL
Specific Gravity, Urine: 1.019 (ref 1.005–1.030)
pH: 6 (ref 5.0–8.0)

## 2023-03-29 LAB — POCT PREGNANCY, URINE: Preg Test, Ur: POSITIVE — AB

## 2023-03-29 MED ORDER — IBUPROFEN 600 MG PO TABS
600.0000 mg | ORAL_TABLET | Freq: Once | ORAL | Status: AC
  Administered 2023-03-29: 600 mg via ORAL
  Filled 2023-03-29: qty 1

## 2023-03-29 NOTE — MAU Provider Note (Signed)
History     CSN: [id number]  Arrival date and time: 03/29/23 1258   None     Chief Complaint  Patient presents with   Abdominal Pain   HPI  [name] Is a a 32 y.o. female [phone] with generalized abdominal pain.  The pain worsens when she walks. She never had this pain with her other pregnancies. She has not tried anything for the pain. The pain is generalized. She has no bleeding or constipation. She has not started prenatal care. Unsure of LMP   Reports nausea and vomiting.   OB History     Gravida  6   Para  3   Term  3   Preterm      AB  2   Living  3      SAB  2   IAB      Ectopic      Multiple  0   Live Births  3           Past Medical History:  Diagnosis Date   Anemia    Gestational hypertension, third trimester 01/16/2020   Ovarian cyst     Past Surgical History:  Procedure Laterality Date   CHOLECYSTECTOMY     OVARIAN CYST SURGERY      Family History  Problem Relation Age of Onset   Healthy Mother    Healthy Father     Social History   Tobacco Use   Smoking status: Never   Smokeless tobacco: Never  Vaping Use   Vaping Use: Never used  Substance Use Topics   Alcohol use: Never   Drug use: Never    Allergies: No Known Allergies  No medications prior to admission.   Results for orders placed or performed during the hospital encounter of 03/29/23 (from the past 48 hour(s))  Pregnancy, urine POC     Status: Abnormal   Collection Time: 03/29/23  1:11 PM  Result Value Ref Range   Preg Test, Ur POSITIVE (A) NEGATIVE    Comment:        THE SENSITIVITY OF THIS METHODOLOGY IS >24 mIU/mL     Results for orders placed or performed during the hospital encounter of 03/29/23 (from the past 48 hour(s))  Pregnancy, urine POC     Status: Abnormal   Collection Time: 03/29/23  1:11 PM  Result Value Ref Range   Preg Test, Ur POSITIVE (A) NEGATIVE    Comment:        THE SENSITIVITY OF THIS METHODOLOGY IS >24  mIU/mL   Urinalysis, Routine w reflex microscopic -Urine, Clean Catch     Status: Abnormal   Collection Time: 03/29/23  1:33 PM  Result Value Ref Range   Color, Urine YELLOW YELLOW   APPearance HAZY (A) CLEAR   Specific Gravity, Urine 1.019 1.005 - 1.030   pH 6.0 5.0 - 8.0   Glucose, UA NEGATIVE NEGATIVE mg/dL   Hgb urine dipstick SMALL (A) NEGATIVE   Bilirubin Urine NEGATIVE NEGATIVE   Ketones, ur NEGATIVE NEGATIVE mg/dL   Protein, ur NEGATIVE NEGATIVE mg/dL   Nitrite NEGATIVE NEGATIVE   Leukocytes,Ua NEGATIVE NEGATIVE   RBC / HPF 11-20 0 - 5 RBC/hpf   WBC, UA 0-5 0 - 5 WBC/hpf   Bacteria, UA RARE (A) NONE SEEN   Squamous Epithelial / HPF 6-10 0 - 5 /HPF   Mucus PRESENT     Comment: Performed at [facility], [address], [place], [place] [address]  US OB Comp Less 14 Wks  Result Date: 03/29/2023 CLINICAL DATA:  Abdominal pain for the past month.  Unknown LMP. EXAM: OBSTETRIC <14 WK ULTRASOUND TECHNIQUE: Transabdominal ultrasound was performed for evaluation of the gestation as well as the maternal uterus and adnexal regions. COMPARISON:  None Available. FINDINGS: Intrauterine gestational sac: Single. Yolk sac:  Not Visualized. Embryo:  Visualized. Cardiac Activity: Visualized. Heart Rate: 171 bpm CRL:   68.1 mm   13 w 1 d                  [place] EDC: 10/03/2023 Subchorionic hemorrhage:  None visualized. Maternal uterus/adnexae: Unremarkable. Left ovarian corpus luteum. No free fluid in the pelvis. IMPRESSION: 1. Single live intrauterine pregnancy with estimated gestational age of [redacted] weeks, 1 day. No acute abnormality. Electronically Signed   By: Obie Dredge M.D.   On: 03/29/2023 15:01     Review of Systems  Constitutional:  Negative for fever.  Gastrointestinal:  Positive for nausea and vomiting.  Genitourinary:  Negative for urgency.  Neurological:  Positive for headaches.   Physical Exam   Blood pressure 127/75, pulse 96, temperature 98.8 F (37.1 C), temperature  source Oral, resp. rate 15, last menstrual period 06/30/2022, SpO2 100 %, not currently breastfeeding.  Physical Exam Constitutional:      General: She is not in acute distress.    Appearance: She is well-developed. She is not ill-appearing, toxic-appearing or diaphoretic.  Abdominal:     Tenderness: There is generalized abdominal tenderness. There is no guarding or rebound.  Skin:    General: Skin is warm.  Neurological:     Mental Status: She is alert and oriented to person, place, and time.    MAU Course  Procedures  MDM  US shows IUP @ 13 weeks.  Wet prep and GC lost by lab.  Ibuprofen given 600 mg, patient decreased   Assessment and Plan   A:  1. Abdominal pain during pregnancy, antepartum   2. [redacted] weeks gestation of pregnancy      P:  Dc home Change positions slowly Ok to use ibuprofen and tylenol as needed, sparingly.  Return to MAU if symptoms worsen  Otillia Cordone, Harolyn Rutherford, NP 03/29/2023 8:40 PM

## 2023-03-29 NOTE — MAU Note (Signed)
.  Shelly Reed is a 32 y.o. at [redacted]w[redacted]d here in MAU reporting: lower abd pain for the past month that worsened last night.  Denies bleeding.  States she has been seen at health dept. Once for intake but has not had an ultrasound for this pregnancy.   LMP:  Onset of complaint: 1 month  Pain score: 9 Vitals:   03/29/23 1318  BP: 127/75  Pulse: 96  Resp: 15  Temp: 98.8 F (37.1 C)  SpO2: 100%     FHT:  Lab orders placed from triage:   ua

## 2023-03-31 LAB — GC/CHLAMYDIA PROBE AMP (~~LOC~~) NOT AT ARMC
Chlamydia: NEGATIVE
Comment: NEGATIVE
Comment: NORMAL
Neisseria Gonorrhea: NEGATIVE

## 2023-04-09 LAB — OB RESULTS CONSOLE HEPATITIS B SURFACE ANTIGEN: Hepatitis B Surface Ag: NEGATIVE

## 2023-04-09 LAB — OB RESULTS CONSOLE HIV ANTIBODY (ROUTINE TESTING): HIV: NONREACTIVE

## 2023-04-09 LAB — HEPATITIS C ANTIBODY: HCV Ab: NEGATIVE

## 2023-04-09 LAB — OB RESULTS CONSOLE RUBELLA ANTIBODY, IGM: Rubella: IMMUNE

## 2023-04-15 LAB — OB RESULTS CONSOLE GBS: GBS: POSITIVE

## 2023-09-30 ENCOUNTER — Inpatient Hospital Stay (HOSPITAL_COMMUNITY): Payer: Medicaid Other | Admitting: Anesthesiology

## 2023-09-30 ENCOUNTER — Inpatient Hospital Stay (HOSPITAL_COMMUNITY)
Admission: AD | Admit: 2023-09-30 | Discharge: 2023-10-02 | DRG: 807 | Disposition: A | Discharge status: Home / Self Care | Payer: Medicaid Other | Attending: Obstetrics & Gynecology | Admitting: Obstetrics & Gynecology

## 2023-09-30 ENCOUNTER — Encounter (HOSPITAL_COMMUNITY): Payer: Self-pay | Admitting: Obstetrics & Gynecology

## 2023-09-30 ENCOUNTER — Other Ambulatory Visit: Payer: Self-pay

## 2023-09-30 DIAGNOSIS — O9902 Anemia complicating childbirth: Secondary | ICD-10-CM | POA: Diagnosis present

## 2023-09-30 DIAGNOSIS — O99820 Streptococcus B carrier state complicating pregnancy: Secondary | ICD-10-CM | POA: Diagnosis not present

## 2023-09-30 DIAGNOSIS — O99824 Streptococcus B carrier state complicating childbirth: Secondary | ICD-10-CM | POA: Diagnosis present

## 2023-09-30 DIAGNOSIS — O134 Gestational [pregnancy-induced] hypertension without significant proteinuria, complicating childbirth: Secondary | ICD-10-CM | POA: Diagnosis present

## 2023-09-30 DIAGNOSIS — Z603 Acculturation difficulty: Secondary | ICD-10-CM | POA: Diagnosis present

## 2023-09-30 DIAGNOSIS — R030 Elevated blood-pressure reading, without diagnosis of hypertension: Secondary | ICD-10-CM | POA: Diagnosis present

## 2023-09-30 DIAGNOSIS — Z9049 Acquired absence of other specified parts of digestive tract: Secondary | ICD-10-CM

## 2023-09-30 DIAGNOSIS — O139 Gestational [pregnancy-induced] hypertension without significant proteinuria, unspecified trimester: Principal | ICD-10-CM

## 2023-09-30 DIAGNOSIS — Z3A39 39 weeks gestation of pregnancy: Secondary | ICD-10-CM | POA: Diagnosis not present

## 2023-09-30 LAB — COMPREHENSIVE METABOLIC PANEL
ALT: 18 U/L (ref 0–44)
AST: 25 U/L (ref 15–41)
Albumin: 2.9 g/dL — ABNORMAL LOW (ref 3.5–5.0)
Alkaline Phosphatase: 195 U/L — ABNORMAL HIGH (ref 38–126)
Anion gap: 7 (ref 5–15)
BUN: 8 mg/dL (ref 6–20)
CO2: 17 mmol/L — ABNORMAL LOW (ref 22–32)
Calcium: 8.6 mg/dL — ABNORMAL LOW (ref 8.9–10.3)
Chloride: 109 mmol/L (ref 98–111)
Creatinine, Ser: 0.43 mg/dL — ABNORMAL LOW (ref 0.44–1.00)
GFR, Estimated: >60 mL/min (ref >60)
Glucose, Bld: 84 mg/dL (ref 70–99)
Potassium: 3.6 mmol/L (ref 3.5–5.1)
Sodium: 133 mmol/L — ABNORMAL LOW (ref 135–145)
Total Bilirubin: 0.7 mg/dL (ref <1.2)
Total Protein: 6.6 g/dL (ref 6.5–8.1)

## 2023-09-30 LAB — CBC
HCT: 35.8 % — ABNORMAL LOW (ref 36.0–46.0)
Hemoglobin: 11.2 g/dL — ABNORMAL LOW (ref 12.0–15.0)
MCH: 25.2 pg — ABNORMAL LOW (ref 26.0–34.0)
MCHC: 31.3 g/dL (ref 30.0–36.0)
MCV: 80.4 fL (ref 80.0–100.0)
Platelets: 213 10*3/uL (ref 150–400)
RBC: 4.45 MIL/uL (ref 3.87–5.11)
RDW: 21.8 % — ABNORMAL HIGH (ref 11.5–15.5)
WBC: 9.3 10*3/uL (ref 4.0–10.5)
nRBC: 0 % (ref 0.0–0.2)

## 2023-09-30 LAB — PROTEIN / CREATININE RATIO, URINE
Creatinine, Urine: 86 mg/dL
Protein Creatinine Ratio: 0.23 mg/mg{creat} — ABNORMAL HIGH (ref 0.00–0.15)
Total Protein, Urine: 20 mg/dL

## 2023-09-30 LAB — TYPE AND SCREEN
ABO/RH(D): O POS
Antibody Screen: NEGATIVE

## 2023-09-30 MED ORDER — TERBUTALINE SULFATE 1 MG/ML IJ SOLN
0.2500 mg | Freq: Once | INTRAMUSCULAR | Status: DC | PRN
Start: 2023-09-30 — End: 2023-10-01

## 2023-09-30 MED ORDER — OXYCODONE-ACETAMINOPHEN 5-325 MG PO TABS: 2.0000 | ORAL_TABLET | ORAL | Status: DC | PRN

## 2023-09-30 MED ORDER — EPHEDRINE 5 MG/ML INJ
10.0000 mg | INTRAVENOUS | Status: DC | PRN
Start: 2023-09-30 — End: 2023-10-01

## 2023-09-30 MED ORDER — OXYTOCIN-SODIUM CHLORIDE 30-0.9 UT/500ML-% IV SOLN
2.5000 [IU]/h | INTRAVENOUS | Status: DC
  Administered 2023-10-01: 2.5 [IU]/h via INTRAVENOUS
  Filled 2023-09-30: qty 500

## 2023-09-30 MED ORDER — LACTATED RINGERS IV SOLN
500.0000 mL | Freq: Once | INTRAVENOUS | Status: AC
  Administered 2023-09-30: 500 mL via INTRAVENOUS

## 2023-09-30 MED ORDER — FENTANYL CITRATE (PF) 100 MCG/2ML IJ SOLN: 50.0000 ug | INTRAMUSCULAR | Status: DC | PRN

## 2023-09-30 MED ORDER — PHENYLEPHRINE 80 MCG/ML (10ML) SYRINGE FOR IV PUSH (FOR BLOOD PRESSURE SUPPORT)
80.0000 ug | PREFILLED_SYRINGE | INTRAVENOUS | Status: DC | PRN
  Filled 2023-09-30: qty 10

## 2023-09-30 MED ORDER — ACETAMINOPHEN 325 MG PO TABS
650.0000 mg | ORAL_TABLET | ORAL | Status: DC | PRN
  Administered 2023-10-01: 650 mg via ORAL
  Filled 2023-09-30: qty 2

## 2023-09-30 MED ORDER — PHENYLEPHRINE 80 MCG/ML (10ML) SYRINGE FOR IV PUSH (FOR BLOOD PRESSURE SUPPORT)
80.0000 ug | PREFILLED_SYRINGE | INTRAVENOUS | Status: DC | PRN
Start: 2023-09-30 — End: 2023-10-01

## 2023-09-30 MED ORDER — LIDOCAINE-EPINEPHRINE (PF) 1.5 %-1:200000 IJ SOLN
INTRAMUSCULAR | Status: DC | PRN
  Administered 2023-09-30: 3 mL via EPIDURAL

## 2023-09-30 MED ORDER — OXYTOCIN-SODIUM CHLORIDE 30-0.9 UT/500ML-% IV SOLN
1.0000 m[IU]/min | INTRAVENOUS | Status: DC
  Administered 2023-09-30: 2 m[IU]/min via INTRAVENOUS

## 2023-09-30 MED ORDER — OXYCODONE-ACETAMINOPHEN 5-325 MG PO TABS: 1.0000 | ORAL_TABLET | ORAL | Status: DC | PRN

## 2023-09-30 MED ORDER — SODIUM CHLORIDE 0.9 % IV SOLN
5.0000 10*6.[IU] | Freq: Once | INTRAVENOUS | Status: AC
  Administered 2023-09-30: 5 10*6.[IU] via INTRAVENOUS
  Filled 2023-09-30: qty 5

## 2023-09-30 MED ORDER — LABETALOL HCL 5 MG/ML IV SOLN: 20.0000 mg | INTRAVENOUS | Status: DC | PRN

## 2023-09-30 MED ORDER — HYDRALAZINE HCL 20 MG/ML IJ SOLN: 10.0000 mg | INTRAMUSCULAR | Status: DC | PRN

## 2023-09-30 MED ORDER — SOD CITRATE-CITRIC ACID 500-334 MG/5ML PO SOLN: 30.0000 mL | ORAL | Status: DC | PRN

## 2023-09-30 MED ORDER — OXYTOCIN BOLUS FROM INFUSION
333.0000 mL | Freq: Once | INTRAVENOUS | Status: AC
  Administered 2023-10-01: 333 mL via INTRAVENOUS

## 2023-09-30 MED ORDER — LACTATED RINGERS IV SOLN: 500.0000 mL | INTRAVENOUS | Status: DC | PRN

## 2023-09-30 MED ORDER — FENTANYL-BUPIVACAINE-NACL 0.5-0.125-0.9 MG/250ML-% EP SOLN
12.0000 mL/h | EPIDURAL | Status: DC | PRN
  Administered 2023-09-30: 12 mL/h via EPIDURAL
  Filled 2023-09-30: qty 250

## 2023-09-30 MED ORDER — PENICILLIN G POT IN DEXTROSE 60000 UNIT/ML IV SOLN
3.0000 10*6.[IU] | INTRAVENOUS | Status: DC
  Administered 2023-09-30 – 2023-10-01 (×2): 3 10*6.[IU] via INTRAVENOUS
  Filled 2023-09-30 (×2): qty 50

## 2023-09-30 MED ORDER — LABETALOL HCL 5 MG/ML IV SOLN: 40.0000 mg | INTRAVENOUS | Status: DC | PRN

## 2023-09-30 MED ORDER — LIDOCAINE HCL (PF) 1 % IJ SOLN
30.0000 mL | INTRAMUSCULAR | Status: DC | PRN
Start: 2023-09-30 — End: 2023-10-01

## 2023-09-30 MED ORDER — LABETALOL HCL 5 MG/ML IV SOLN: 80.0000 mg | INTRAVENOUS | Status: DC | PRN

## 2023-09-30 MED ORDER — LACTATED RINGERS IV SOLN: INTRAVENOUS | Status: DC

## 2023-09-30 MED ORDER — DIPHENHYDRAMINE HCL 50 MG/ML IJ SOLN: 12.5000 mg | INTRAMUSCULAR | Status: DC | PRN

## 2023-09-30 MED ORDER — ONDANSETRON HCL 4 MG/2ML IJ SOLN: 4.0000 mg | Freq: Four times a day (QID) | INTRAMUSCULAR | Status: DC | PRN

## 2023-09-30 NOTE — H&P (Signed)
OBSTETRIC ADMISSION HISTORY AND PHYSICAL  Shelly Reed is a 32 y.o. female [phone] with IUP at [redacted]w[redacted]d by 13 weeks [place] presenting for elevated blood pressure in the office and headache at term. She reports +FMs, No LOF, no VB, no blurry vision, headaches or peripheral edema, and RUQ pain.  She plans on breast and formula feeding. She request [facility]planon at [facility] for [facility] control. She received her prenatal care at [facility]   Dating: By 13 week [place] --->  Estimated Date of Delivery: 10/03/23  Sono:   @[redacted]w[redacted]d , CWD, normal anatomy, cephalic presentation, anterior placental lie, 1280g, 22% EFW  Prenatal History/Complications:  - GBS bacteruria - Gestational hypertension in third trimester - Spanish speaking  Past Medical History: Past Medical History:  Diagnosis Date   Anemia    Gestational hypertension, third trimester 01/16/2020   Ovarian cyst     Past Surgical History: Past Surgical History:  Procedure Laterality Date   CHOLECYSTECTOMY     OVARIAN CYST SURGERY      Obstetrical History: OB History     Gravida  6   Para  3   Term  3   Preterm      AB  2   Living  3      SAB  2   IAB      Ectopic      Multiple  0   Live Births  3           Social History Social History   Socioeconomic History   Marital status: Married    Spouse name: Not on file   Number of children: Not on file   Years of education: Not on file   Highest education level: Not on file  Occupational History   Not on file  Tobacco Use   Smoking status: Never   Smokeless tobacco: Never  Vaping Use   Vaping status: Never Used  Substance and Sexual Activity   Alcohol use: Never   Drug use: Never   Sexual activity: Yes    Birth control/protection: None  Other Topics Concern   Not on file  Social History Narrative   Not on file   Social Determinants of Health   Financial Resource Strain: Not on file  Food Insecurity: No Food Insecurity (09/30/2023)   Hunger Vital Sign     Worried About Running Out of Food in the Last Year: Never true    Ran Out of Food in the Last Year: Never true  Transportation Needs: No Transportation Needs (09/30/2023)   PRAPARE - Administrator, Civil Service (Medical): No    Lack of Transportation (Non-Medical): No  Physical Activity: Not on file  Stress: Not on file  Social Connections: Unknown (03/17/2022)   Received from [facility], [facility]   Social Network    Social Network: Not on file    Family History: Family History  Problem Relation Age of Onset   Healthy Mother    Healthy Father     Allergies: No Known Allergies  No medications prior to admission.   Review of Systems   All systems reviewed and negative except as stated in HPI  Blood pressure 119/60, pulse 84, temperature 98 F (36.7 C), temperature source Oral, resp. rate 16, height 5\' 2"  (1.575 m), weight 81.9 kg, last menstrual period 06/30/2022, not currently breastfeeding. General appearance: alert, cooperative, appears stated age, and mild distress Lungs: clear to auscultation bilaterally Heart: regular rate and rhythm Abdomen: soft, non-tender; bowel  sounds normal Pelvic: adequate Extremities: Homans sign is negative, no sign of DVT Presentation: cephalic Fetal monitoring Baseline: 145 bpm, Variability: Good {> 6 bpm), Accelerations: Reactive, and Decelerations: Absent Uterine activity Frequency: Every 4 minutes  Dilation: 3.5 Effacement (%): 80 Station: -2 Exam by:: Shelly Nop MD  Prenatal labs: ABO, Rh: --/--/O POS (11/27 1709)O positive Antibody: NEG (11/27 1709)Negative Rubella:  Immune RPR:   nonreactive HBsAg:   nonreactive HIV:   nonreactive GBS:   bacteuria 1 hr Glucola passed 83, repeat 118 Genetic screening  MSAFP negative Anatomy US   Prenatal Transfer Tool  Maternal Diabetes: No Genetic Screening: Normal Maternal Ultrasounds/Referrals: Normal Fetal Ultrasounds or other Referrals:  None Maternal Substance  Abuse:  No Significant Maternal Medications:  None Significant Maternal Lab Results:  Group B Strep positive Number of Prenatal Visits:greater than 3 verified prenatal visits Other Comments:  None  Results for orders placed or performed during the hospital encounter of 09/30/23 (from the past 24 hour(s))  CBC   Collection Time: 09/30/23  5:09 PM  Result Value Ref Range   WBC 9.3 4.0 - 10.5 K/uL   RBC 4.45 3.87 - 5.11 MIL/uL   Hemoglobin 11.2 (L) 12.0 - 15.0 g/dL   HCT 40.9 (L) 81.1 - 91.4 %   MCV 80.4 80.0 - 100.0 fL   MCH 25.2 (L) 26.0 - 34.0 pg   MCHC 31.3 30.0 - 36.0 g/dL   RDW 78.2 (H) 95.6 - 21.3 %   Platelets 213 150 - 400 K/uL   nRBC 0.0 0.0 - 0.2 %  Type and screen [facility]   Collection Time: 09/30/23  5:09 PM  Result Value Ref Range   ABO/RH(D) O POS    Antibody Screen NEG    Sample Expiration      10/03/2023,2359 Performed at [facility], [address] [place], [place] [address]     Patient Active Problem List   Diagnosis Date Noted   Gestational hypertension affecting third pregnancy 09/30/2023   ASCUS with positive high risk HPV cervical 07/10/2022   History of chlamydia infection 02/12/2021    Assessment/Plan:  Shelly Reed is a 32 y.o. [id number] at [redacted]w[redacted]d here for IOL for gHTN  #Labor: Will start Pitocin to initiate contractions and rupture after 4 hours of adequate GBS prophylaxis. #Pain: Per pt request #FWB: Cat I #ID:  GBS bacteuria; PCN #MOF: Breast and bottle #MOC: OP Nexplanon at [facility] #Circ:  Undecided  Celedonio Savage, MD  09/30/2023, 6:02 PM

## 2023-09-30 NOTE — Anesthesia Procedure Notes (Signed)
Epidural Patient location during procedure: OB Start time: 09/30/2023 10:50 PM End time: 09/30/2023 11:08 PM  Staffing Anesthesiologist: Lewie Loron, MD Performed: anesthesiologist   Preanesthetic Checklist Completed: patient identified, IV checked, risks and benefits discussed, monitors and equipment checked, pre-op evaluation and timeout performed  Epidural Patient position: sitting Prep: DuraPrep and site prepped and draped Patient monitoring: heart rate, continuous pulse ox and blood pressure Approach: midline Location: L3-L4 Injection technique: LOR air and LOR saline  Needle:  Needle type: Tuohy  Needle gauge: 17 G Needle length: 9 cm Needle insertion depth: 5.5 cm Catheter type: closed end flexible Catheter size: 19 Gauge Catheter at skin depth: 12 cm Test dose: negative and 1.5% lidocaine with Epi 1:200 K  Assessment Sensory level: T8 Events: blood not aspirated, no cerebrospinal fluid, injection not painful, no injection resistance, no paresthesia and negative IV test  Additional Notes Reason for block:procedure for pain

## 2023-09-30 NOTE — Anesthesia Preprocedure Evaluation (Signed)
Anesthesia Evaluation  Patient identified by MRN, date of birth, ID band Patient awake    Reviewed: Allergy & Precautions, Patient's Chart, lab work & pertinent test results  History of Anesthesia Complications Negative for: history of anesthetic complications  Airway Mallampati: II  TM Distance: >3 FB Neck ROM: Full    Dental no notable dental hx.    Pulmonary former smoker   Pulmonary exam normal        Cardiovascular hypertension, Normal cardiovascular exam     Neuro/Psych negative neurological ROS  negative psych ROS   GI/Hepatic negative GI ROS, Neg liver ROS,,,  Endo/Other  negative endocrine ROS    Renal/GU negative Renal ROS     Musculoskeletal negative musculoskeletal ROS (+)    Abdominal  (+) + obese  Peds  Hematology  (+) Blood dyscrasia, anemia   Anesthesia Other Findings   Reproductive/Obstetrics (+) Pregnancy                             Anesthesia Physical Anesthesia Plan  ASA: 3  Anesthesia Plan: Epidural   Post-op Pain Management:    Induction:   PONV Risk Score and Plan: Treatment may vary due to age or medical condition  Airway Management Planned: Natural Airway  Additional Equipment: Fetal Monitoring  Intra-op Plan:   Post-operative Plan:   Informed Consent: I have reviewed the patients History and Physical, chart, labs and discussed the procedure including the risks, benefits and alternatives for the proposed anesthesia with the patient or authorized representative who has indicated his/her understanding and acceptance.     Interpreter used for [employer] Discussed with:   Anesthesia Plan Comments:         Anesthesia Quick Evaluation

## 2023-09-30 NOTE — MAU Provider Note (Signed)
OBSTETRIC ADMISSION HISTORY AND PHYSICAL  Shelly Reed is a 32 y.o. female [phone] with IUP at [redacted]w[redacted]d by 13 weeks [place] presenting for elevated blood pressure in the office and headache at term. She reports +FMs, No LOF, no VB, no blurry vision, headaches or peripheral edema, and RUQ pain.  She plans on breast and formula feeding. She request nexplanon at [facility] for birth control. She received her prenatal care at [facility]   Dating: By 13 week [place] --->  Estimated Date of Delivery: 10/03/23  Sono:   @[redacted]w[redacted]d , CWD, normal anatomy, cephalic presentation, anterior placental lie, 1280g, 22% EFW  Prenatal History/Complications:  - GBS bacteruria - Gestational hypertension in third trimester - Spanish speaking  Past Medical History: Past Medical History:  Diagnosis Date   Anemia    Gestational hypertension, third trimester 01/16/2020   Ovarian cyst     Past Surgical History: Past Surgical History:  Procedure Laterality Date   CHOLECYSTECTOMY     OVARIAN CYST SURGERY      Obstetrical History: OB History     Gravida  6   Para  3   Term  3   Preterm      AB  2   Living  3      SAB  2   IAB      Ectopic      Multiple  0   Live Births  3           Social History Social History   Socioeconomic History   Marital status: Married    Spouse name: Not on file   Number of children: Not on file   Years of education: Not on file   Highest education level: Not on file  Occupational History   Not on file  Tobacco Use   Smoking status: Never   Smokeless tobacco: Never  Vaping Use   Vaping status: Never Used  Substance and Sexual Activity   Alcohol use: Never   Drug use: Never   Sexual activity: Yes    Birth control/protection: None  Other Topics Concern   Not on file  Social History Narrative   Not on file   Social Determinants of Health   Financial Resource Strain: Not on file  Food Insecurity: No Food Insecurity (07/10/2022)   Hunger Vital Sign     Worried About Running Out of Food in the Last Year: Never true    Ran Out of Food in the Last Year: Never true  Transportation Needs: No Transportation Needs (07/10/2022)   PRAPARE - Administrator, Civil Service (Medical): No    Lack of Transportation (Non-Medical): No  Physical Activity: Not on file  Stress: Not on file  Social Connections: Unknown (03/17/2022)   Received from [facility], [facility]   Social Network    Social Network: Not on file    Family History: Family History  Problem Relation Age of Onset   Healthy Mother    Healthy Father     Allergies: No Known Allergies  No medications prior to admission.   Review of Systems   All systems reviewed and negative except as stated in HPI  Blood pressure 119/60, pulse 84, temperature 98 F (36.7 C), temperature source Oral, resp. rate 16, height 5\' 2"  (1.575 m), weight 81.9 kg, last menstrual period 06/30/2022, not currently breastfeeding. General appearance: alert, cooperative, appears stated age, and mild distress Lungs: clear to auscultation bilaterally Heart: regular rate and rhythm Abdomen: soft, non-tender; bowel  sounds normal Pelvic: adequate Extremities: Homans sign is negative, no sign of DVT Presentation: cephalic Fetal monitoring Baseline: 145 bpm, Variability: Good {> 6 bpm), Accelerations: Reactive, and Decelerations: Absent Uterine activity Frequency: Every 4 minutes  Dilation: 3.5 Effacement (%): 80 Station: -2 Exam by:: Cresenzo MD  Prenatal labs: ABO, Rh: --/--/PENDING (11/27 1621)O positive Antibody: PENDING (11/27 1621)Negative Rubella:  Immune RPR:   nonreactive HBsAg:   nonreactive HIV:   nonreactive GBS:   bacteuria 1 hr Glucola passed 83, repeat 118 Genetic screening  MSAFP negative Anatomy US   Prenatal Transfer Tool  Maternal Diabetes: No Genetic Screening: Normal Maternal Ultrasounds/Referrals: Normal Fetal Ultrasounds or other Referrals:  None Maternal  Substance Abuse:  No Significant Maternal Medications:  None Significant Maternal Lab Results:  Group B Strep positive Number of Prenatal Visits:greater than 3 verified prenatal visits Other Comments:  None  Results for orders placed or performed during the hospital encounter of 09/30/23 (from the past 24 hour(s))  Type and screen [facility]   Collection Time: 09/30/23  4:21 PM  Result Value Ref Range   ABO/RH(D) PENDING    Antibody Screen PENDING    Sample Expiration      10/03/2023,2359 Performed at [facility], [address], [place], [place] [address]     Patient Active Problem List   Diagnosis Date Noted   Gestational hypertension affecting third pregnancy 09/30/2023   ASCUS with positive high risk HPV cervical 07/10/2022   History of chlamydia infection 02/12/2021    Assessment/Plan:  Shelly Reed is a 32 y.o. [id number] at [redacted]w[redacted]d here for IOL for gHTN  #Labor: Augmentation with AROM and Pitocin as indicated #Pain: Per pt request #FWB: Cat I #ID:  GBS bacteuria; PCN #MOF: Breast and bottle #MOC: OP Nexplanon at [facility] #Circ:  Donnajean Lopes, MD  09/30/2023, 5:51 PM

## 2023-10-01 ENCOUNTER — Encounter (HOSPITAL_COMMUNITY): Payer: Self-pay | Admitting: Obstetrics & Gynecology

## 2023-10-01 DIAGNOSIS — O134 Gestational [pregnancy-induced] hypertension without significant proteinuria, complicating childbirth: Secondary | ICD-10-CM | POA: Diagnosis not present

## 2023-10-01 DIAGNOSIS — Z3A39 39 weeks gestation of pregnancy: Secondary | ICD-10-CM | POA: Diagnosis not present

## 2023-10-01 DIAGNOSIS — O99820 Streptococcus B carrier state complicating pregnancy: Secondary | ICD-10-CM

## 2023-10-01 LAB — RPR: RPR Ser Ql: NONREACTIVE

## 2023-10-01 LAB — CBC
HCT: 31.7 % — ABNORMAL LOW (ref 36.0–46.0)
Hemoglobin: 9.9 g/dL — ABNORMAL LOW (ref 12.0–15.0)
MCH: 25.3 pg — ABNORMAL LOW (ref 26.0–34.0)
MCHC: 31.2 g/dL (ref 30.0–36.0)
MCV: 80.9 fL (ref 80.0–100.0)
Platelets: 190 10*3/uL (ref 150–400)
RBC: 3.92 MIL/uL (ref 3.87–5.11)
RDW: 21.6 % — ABNORMAL HIGH (ref 11.5–15.5)
WBC: 9.8 10*3/uL (ref 4.0–10.5)
nRBC: 0 % (ref 0.0–0.2)

## 2023-10-01 MED ORDER — IBUPROFEN 600 MG PO TABS
600.0000 mg | ORAL_TABLET | Freq: Four times a day (QID) | ORAL | Status: DC
  Administered 2023-10-01 – 2023-10-02 (×5): 600 mg via ORAL
  Filled 2023-10-01 (×5): qty 1

## 2023-10-01 MED ORDER — MEASLES, MUMPS & RUBELLA VAC IJ SOLR
0.5000 mL | Freq: Once | INTRAMUSCULAR | Status: DC
Start: 2023-10-02 — End: 2023-10-02

## 2023-10-01 MED ORDER — PRENATAL MULTIVITAMIN CH
1.0000 | ORAL_TABLET | Freq: Every day | ORAL | Status: DC
  Administered 2023-10-02: 1 via ORAL
  Filled 2023-10-01: qty 1

## 2023-10-01 MED ORDER — SODIUM CHLORIDE 0.9 % IV SOLN: 250.0000 mL | INTRAVENOUS | Status: DC | PRN

## 2023-10-01 MED ORDER — SODIUM CHLORIDE 0.9% FLUSH: 3.0000 mL | INTRAVENOUS | Status: DC | PRN

## 2023-10-01 MED ORDER — SODIUM CHLORIDE 0.9% FLUSH
3.0000 mL | Freq: Two times a day (BID) | INTRAVENOUS | Status: DC
Start: 2023-10-01 — End: 2023-10-02
  Administered 2023-10-01 (×2): 3 mL via INTRAVENOUS

## 2023-10-01 MED ORDER — BENZOCAINE-MENTHOL 20-0.5 % EX AERO
1.0000 | INHALATION_SPRAY | CUTANEOUS | Status: DC | PRN
  Administered 2023-10-01: 1 via TOPICAL
  Filled 2023-10-01: qty 56

## 2023-10-01 MED ORDER — COCONUT OIL OIL: 1.0000 | TOPICAL_OIL | Status: DC | PRN

## 2023-10-01 MED ORDER — MISOPROSTOL 200 MCG PO TABS
1000.0000 ug | ORAL_TABLET | Freq: Once | ORAL | Status: AC
  Administered 2023-10-01: 1000 ug via RECTAL

## 2023-10-01 MED ORDER — MISOPROSTOL 200 MCG PO TABS
ORAL_TABLET | ORAL | Status: AC
  Filled 2023-10-01: qty 5

## 2023-10-01 MED ORDER — FUROSEMIDE 20 MG PO TABS
20.0000 mg | ORAL_TABLET | Freq: Every day | ORAL | Status: DC
  Administered 2023-10-01: 20 mg via ORAL
  Filled 2023-10-01 (×2): qty 1

## 2023-10-01 MED ORDER — ZOLPIDEM TARTRATE 5 MG PO TABS: 5.0000 mg | ORAL_TABLET | Freq: Every evening | ORAL | Status: DC | PRN

## 2023-10-01 MED ORDER — SENNOSIDES-DOCUSATE SODIUM 8.6-50 MG PO TABS
2.0000 | ORAL_TABLET | ORAL | Status: DC
Start: 2023-10-01 — End: 2023-10-02
  Administered 2023-10-01 – 2023-10-02 (×2): 2 via ORAL
  Filled 2023-10-01 (×2): qty 2

## 2023-10-01 MED ORDER — ONDANSETRON HCL 4 MG/2ML IJ SOLN: 4.0000 mg | INTRAMUSCULAR | Status: DC | PRN

## 2023-10-01 MED ORDER — SIMETHICONE 80 MG PO CHEW: 80.0000 mg | CHEWABLE_TABLET | ORAL | Status: DC | PRN

## 2023-10-01 MED ORDER — DIPHENHYDRAMINE HCL 25 MG PO CAPS: 25.0000 mg | ORAL_CAPSULE | Freq: Four times a day (QID) | ORAL | Status: DC | PRN

## 2023-10-01 MED ORDER — TETANUS-DIPHTH-ACELL PERTUSSIS 5-2.5-18.5 LF-MCG/0.5 IM SUSY
0.5000 mL | PREFILLED_SYRINGE | Freq: Once | INTRAMUSCULAR | Status: DC
Start: 2023-10-02 — End: 2023-10-02

## 2023-10-01 MED ORDER — WITCH HAZEL-GLYCERIN EX PADS: 1.0000 | MEDICATED_PAD | CUTANEOUS | Status: DC | PRN

## 2023-10-01 MED ORDER — TRANEXAMIC ACID-NACL 1000-0.7 MG/100ML-% IV SOLN
INTRAVENOUS | Status: AC
  Administered 2023-10-01: 1000 mg
  Filled 2023-10-01: qty 100

## 2023-10-01 MED ORDER — ONDANSETRON HCL 4 MG PO TABS: 4.0000 mg | ORAL_TABLET | ORAL | Status: DC | PRN

## 2023-10-01 MED ORDER — TRANEXAMIC ACID-NACL 1000-0.7 MG/100ML-% IV SOLN
1000.0000 mg | INTRAVENOUS | Status: AC
  Administered 2023-10-01: 1000 mg via INTRAVENOUS

## 2023-10-01 MED ORDER — DIBUCAINE (PERIANAL) 1 % EX OINT: 1.0000 | TOPICAL_OINTMENT | CUTANEOUS | Status: DC | PRN

## 2023-10-01 MED ORDER — CEFAZOLIN SODIUM-DEXTROSE 2-4 GM/100ML-% IV SOLN
2.0000 g | Freq: Once | INTRAVENOUS | Status: AC
  Administered 2023-10-01: 2 g via INTRAVENOUS
  Filled 2023-10-01: qty 100

## 2023-10-01 MED ORDER — ACETAMINOPHEN 325 MG PO TABS
650.0000 mg | ORAL_TABLET | ORAL | Status: DC | PRN
  Administered 2023-10-02: 650 mg via ORAL
  Filled 2023-10-01: qty 2

## 2023-10-01 NOTE — Lactation Note (Signed)
This note was copied from a baby's chart. Lactation Consultation Note  Patient Name: Shelly Reed [id number] Date: 10/01/2023 Age:32 years Reason for consult: Initial assessment;Term  P4- MOB plans to both breast and formula feed. MOB denies needing any assistance and states that infant is nursing really well so far. MOB requested a hand pump because she does not like the DEBP. LC provided MOB with manual pump and explained how to use it.  LC reviewed feeding infant on cue 8-12x in 24 hrs, not allowing infant to go over 3 hrs without a feeding, CDC milk storage guidelines and LC services handout. LC encouraged MOB to call for further assistance as needed.  Maternal Data Does the patient have breastfeeding experience prior to this delivery?: Yes How long did the patient breastfeed?: 4 months for all three older children  Feeding Mother's Current Feeding Choice: Breast Milk and Formula Nipple Type: Slow - flow  Lactation Tools Discussed/Used Tools: Pump;Flanges Flange Size: 21 Breast pump type: Manual Pump Education: Setup, frequency, and cleaning;Milk Storage Reason for Pumping: MOB's request Pumping frequency: 15-20 min every 2-3 hrs as needed  Interventions Interventions: Breast feeding basics reviewed;Hand pump;Education;LC Services brochure  Discharge Discharge Education: Warning signs for feeding baby WIC Program: No (MOB declined LC sending referral)  Consult Status Consult Status: Follow-up Date: 10/02/23 Follow-up type: In-patient    Dema Severin BS, IBCLC 10/01/2023, 1:08 PM

## 2023-10-01 NOTE — Progress Notes (Signed)
Pt significant other, Elder Product/process development scientist, signed interpreter waiver form.  He interpreted admission education and questions from RN to spanish for patient.  Pt and husband shown how to set up [employer] app and set up account with sign-up code.

## 2023-10-01 NOTE — Progress Notes (Signed)
Labor Progress Note  Shelly Reed is a 32 y.o. [id number] at [redacted]w[redacted]d presented for IOL 2/2 GHTN  S: patient comfortable, agreeable to AROM at this time.   O:  VSS, SORA  EFM:125bpm/Moderate variability/ 15x15 accels/ Variable decels CAT: 1 Toco: regular, every 4 minutes   CVE: Dilation: 6.5 Effacement (%): 90 Cervical Position: Middle Station: -2 Presentation: Vertex Exam by:: Ulis Rias, RN   A&P: 32 y.o. [id number] [redacted]w[redacted]d  here for IOL as above  #Labor: Progressing well. AROM 0230 cf, pt tolerated well. Cephalic on BSUS however cord close by given low lying placenta.  Nursing made aware and kept patient upright to ensure baby doesn't disengage  #Pain: per patient request  #FWB: CAT 1 #GBS positive PCN  #GHTN: PreE labs negative, normotensive since admission   Hessie Dibble, MD FMOB Fellow, Faculty practice [facility], [facility] 10/03/23  10:37 AM

## 2023-10-01 NOTE — Discharge Summary (Signed)
Postpartum Discharge Summary  Date of Service updated***     Patient Name: Shelly Reed DOB: 1991-05-18 MRN: [id number]  Date of admission: 09/30/2023 Delivery date:10/01/2023 Delivering provider: CHUBB, CASEY C Date of discharge: 10/02/2023  Admitting diagnosis: Gestational hypertension affecting third pregnancy [O13.9] Intrauterine pregnancy: [redacted]w[redacted]d     Secondary diagnosis:  Principal Problem:   Gestational hypertension affecting third pregnancy  Additional problems: ***    Discharge diagnosis: Term Pregnancy Delivered and Gestational Hypertension                                              Post partum procedures:{Postpartum procedures:23558} Augmentation: AROM and Pitocin Complications: None  Hospital course: Induction of Labor With Vaginal Delivery   32 y.o. yo [id number] at [redacted]w[redacted]d was admitted to the hospital 09/30/2023 for induction of labor.  Indication for induction: Gestational hypertension.  Patient had an labor course was uncomplicated.  Membrane Rupture Time/Date: 2:24 AM,10/01/2023  Delivery Method:Vaginal, Spontaneous Operative Delivery:N/A Episiotomy: None Lacerations:  None Details of delivery can be found in separate delivery note.  Patient had a postpartum course complicated by***. Patient is discharged home 10/02/23.  Newborn Data: Birth date:10/01/2023 Birth time:6:02 AM Gender:Female Living status:Living Apgars:8 ,9  Weight:3030 g  Magnesium Sulfate received: {Mag received:30440022} BMZ received: No Rhophylac:N/A MMR:N/A T-DaP:{Tdap:23962} Flu: {WUX:32440} RSV Vaccine received: {RSV:31013} Transfusion:{Transfusion received:30440034}  Immunizations received: There is no immunization history for the selected administration types on file for this patient.  Physical exam  Vitals:   10/01/23 1500 10/01/23 1834 10/01/23 2259 10/02/23 0603  BP: 125/84 130/86 126/82 127/84  Pulse:  82 78 87  Resp: 16 16 20 18   Temp: 99.3 F (37.4  C) 98.4 F (36.9 C) 97.7 F (36.5 C) 97.8 F (36.6 C)  TempSrc: Oral Oral Oral Oral  SpO2: 99% 100% 100% 100%  Weight:      Height:       General: {Exam; general:21111117} Lochia: {Desc; appropriate/inappropriate:30686::"appropriate"} Uterine Fundus: {Desc; firm/soft:30687} Incision: {Exam; incision:21111123} DVT Evaluation: {Exam; NUU:7253664} Labs: Lab Results  Component Value Date   WBC 8.1 10/02/2023   HGB 9.3 (L) 10/02/2023   HCT 29.4 (L) 10/02/2023   MCV 80.3 10/02/2023   PLT 179 10/02/2023      Latest Ref Rng & Units 09/30/2023    5:09 PM  CMP  Glucose 70 - 99 mg/dL 84   BUN 6 - 20 mg/dL 8   Creatinine 4.03 - 4.74 mg/dL 2.59   Sodium 563 - 875 mmol/L 133   Potassium 3.5 - 5.1 mmol/L 3.6   Chloride 98 - 111 mmol/L 109   CO2 22 - 32 mmol/L 17   Calcium 8.9 - 10.3 mg/dL 8.6   Total Protein 6.5 - 8.1 g/dL 6.6   Total Bilirubin <6.4 mg/dL 0.7   Alkaline Phos 38 - 126 U/L 195   AST 15 - 41 U/L 25   ALT 0 - 44 U/L 18    Edinburgh Score:    10/01/2023    3:00 PM  Edinburgh Postnatal Depression Scale Screening Tool  I have been able to laugh and see the funny side of things. 0  I have looked forward with enjoyment to things. 0  I have blamed myself unnecessarily when things went wrong. 0  I have been anxious or worried for no good reason. 0  I have felt scared or panicky  for no good reason. 0  Things have been getting on top of me. 0  I have been so unhappy that I have had difficulty sleeping. 0  I have felt sad or miserable. 0  I have been so unhappy that I have been crying. 0  The thought of harming myself has occurred to me. 0  Edinburgh Postnatal Depression Scale Total 0   Edinburgh Postnatal Depression Scale Total: 0   After visit meds:  Allergies as of 10/02/2023   No Known Allergies      Medication List     TAKE these medications    furosemide 20 MG tablet Commonly known as: LASIX Take 1 tablet (20 mg total) by mouth daily for 4 days.    ibuprofen 600 MG tablet Commonly known as: ADVIL Take 1 tablet (600 mg total) by mouth every 6 (six) hours as needed.   prenatal multivitamin Tabs tablet Take 1 tablet by mouth daily at 12 noon.         Discharge home in stable condition Infant Feeding: {Baby feeding:23562} Infant Disposition:{CHL IP OB HOME WITH KGMWNU:27253} Discharge instruction: per After Visit Summary and Postpartum booklet. Activity: Advance as tolerated. Pelvic rest for 6 weeks.  Diet: {OB GUYQ:03474259} Future Appointments:No future appointments. Follow up Visit:  Follow-up Information     [facility]. Go in 1 week(s).   Specialty: Obstetrics and Gynecology Why: call the office on Wednesday 12/4 if you have not heard about this blood pressure check. Contact information: [address] [place] [address] [phone]               Message [facility] 11/28  Please schedule this patient for a In person postpartum visit in 4 weeks with the following provider: Any provider. Additional Postpartum F/U:BP check 2-3 days  High risk pregnancy complicated by: HTN Delivery mode:  Vaginal, Spontaneous Anticipated Birth Control:   OP Nexplanon at [facility]   10/02/2023 Euless Bing, MD

## 2023-10-02 ENCOUNTER — Other Ambulatory Visit (HOSPITAL_COMMUNITY): Payer: Self-pay

## 2023-10-02 ENCOUNTER — Encounter (HOSPITAL_COMMUNITY): Payer: Self-pay | Admitting: Obstetrics & Gynecology

## 2023-10-02 LAB — CBC
HCT: 29.4 % — ABNORMAL LOW (ref 36.0–46.0)
Hemoglobin: 9.3 g/dL — ABNORMAL LOW (ref 12.0–15.0)
MCH: 25.4 pg — ABNORMAL LOW (ref 26.0–34.0)
MCHC: 31.6 g/dL (ref 30.0–36.0)
MCV: 80.3 fL (ref 80.0–100.0)
Platelets: 179 10*3/uL (ref 150–400)
RBC: 3.66 MIL/uL — ABNORMAL LOW (ref 3.87–5.11)
RDW: 21.2 % — ABNORMAL HIGH (ref 11.5–15.5)
WBC: 8.1 10*3/uL (ref 4.0–10.5)
nRBC: 0 % (ref 0.0–0.2)

## 2023-10-02 MED ORDER — MEDROXYPROGESTERONE ACETATE 150 MG/ML IM SUSP
150.0000 mg | Freq: Once | INTRAMUSCULAR | Status: DC
  Filled 2023-10-02: qty 1

## 2023-10-02 MED ORDER — FUROSEMIDE 20 MG PO TABS
20.0000 mg | ORAL_TABLET | Freq: Every day | ORAL | 0 refills | Status: DC
  Filled 2023-10-02: qty 4, 4d supply, fill #0

## 2023-10-02 MED ORDER — IBUPROFEN 600 MG PO TABS
600.0000 mg | ORAL_TABLET | Freq: Four times a day (QID) | ORAL | 0 refills | Status: DC | PRN
  Filled 2023-10-02: qty 30, 8d supply, fill #0

## 2023-10-02 MED ORDER — FUROSEMIDE 20 MG PO TABS
20.0000 mg | ORAL_TABLET | Freq: Every day | ORAL | Status: DC
  Administered 2023-10-02: 20 mg via ORAL
  Filled 2023-10-02: qty 1

## 2023-10-02 MED ORDER — PRENATAL MULTIVITAMIN CH
1.0000 | ORAL_TABLET | Freq: Every day | ORAL | 1 refills | Status: DC
  Filled 2023-10-02: qty 42, 42d supply, fill #0

## 2023-10-02 NOTE — Anesthesia Postprocedure Evaluation (Signed)
Anesthesia Post Note  Patient: Shelly Reed  Procedure(s) Performed: AN AD HOC LABOR EPIDURAL     Patient location during evaluation: Mother Baby Anesthesia Type: Epidural Level of consciousness: awake Pain management: satisfactory to patient Vital Signs Assessment: post-procedure vital signs reviewed and stable Respiratory status: spontaneous breathing Cardiovascular status: stable Anesthetic complications: no  No notable events documented.  Last Vitals:  Vitals:   10/01/23 2259 10/02/23 0603  BP: 126/82 127/84  Pulse: 78 87  Resp: 20 18  Temp: 36.5 C 36.6 C  SpO2: 100% 100%    Last Pain:  Vitals:   10/02/23 0833  TempSrc:   PainSc: 0-No pain   Pain Goal: Patients Stated Pain Goal: Other (Comment) (patient states tolerable) (09/30/23 2149)                 Cephus Shelling

## 2023-10-12 ENCOUNTER — Telehealth (HOSPITAL_COMMUNITY): Payer: Self-pay | Admitting: *Deleted

## 2023-10-12 NOTE — Telephone Encounter (Signed)
Attempted hospital discharge follow-up call with Language Line interpreter. No answer received and no voicemail set up. Deforest Hoyles, RN, 10/12/23, [phone]

## 2024-02-18 ENCOUNTER — Other Ambulatory Visit (HOSPITAL_COMMUNITY): Payer: Self-pay

## 2024-03-01 ENCOUNTER — Encounter (HOSPITAL_BASED_OUTPATIENT_CLINIC_OR_DEPARTMENT_OTHER): Payer: Self-pay | Admitting: Emergency Medicine

## 2024-03-01 ENCOUNTER — Emergency Department (HOSPITAL_BASED_OUTPATIENT_CLINIC_OR_DEPARTMENT_OTHER)
Admission: EM | Admit: 2024-03-01 | Discharge: 2024-03-01 | Disposition: A | Discharge status: Home / Self Care | Payer: Self-pay | Attending: Emergency Medicine | Admitting: Emergency Medicine

## 2024-03-01 ENCOUNTER — Other Ambulatory Visit: Payer: Self-pay

## 2024-03-01 DIAGNOSIS — N939 Abnormal uterine and vaginal bleeding, unspecified: Secondary | ICD-10-CM | POA: Insufficient documentation

## 2024-03-01 DIAGNOSIS — R102 Pelvic and perineal pain: Secondary | ICD-10-CM | POA: Insufficient documentation

## 2024-03-01 LAB — BASIC METABOLIC PANEL WITH GFR
Anion gap: 10 (ref 5–15)
BUN: 11 mg/dL (ref 6–20)
CO2: 22 mmol/L (ref 22–32)
Calcium: 9.5 mg/dL (ref 8.9–10.3)
Chloride: 105 mmol/L (ref 98–111)
Creatinine, Ser: 0.56 mg/dL (ref 0.44–1.00)
GFR, Estimated: >60 mL/min (ref >60)
Glucose, Bld: 88 mg/dL (ref 70–99)
Potassium: 3.9 mmol/L (ref 3.5–5.1)
Sodium: 137 mmol/L (ref 135–145)

## 2024-03-01 LAB — CBC WITH DIFFERENTIAL/PLATELET
Abs Immature Granulocytes: 0.02 10*3/uL (ref 0.00–0.07)
Basophils Absolute: 0 10*3/uL (ref 0.0–0.1)
Basophils Relative: 0 %
Eosinophils Absolute: 0.2 10*3/uL (ref 0.0–0.5)
Eosinophils Relative: 2 %
HCT: 36.8 % (ref 36.0–46.0)
Hemoglobin: 12.9 g/dL (ref 12.0–15.0)
Immature Granulocytes: 0 %
Lymphocytes Relative: 23 %
Lymphs Abs: 1.7 10*3/uL (ref 0.7–4.0)
MCH: 30.9 pg (ref 26.0–34.0)
MCHC: 35.1 g/dL (ref 30.0–36.0)
MCV: 88 fL (ref 80.0–100.0)
Monocytes Absolute: 0.5 10*3/uL (ref 0.1–1.0)
Monocytes Relative: 7 %
Neutro Abs: 4.7 10*3/uL (ref 1.7–7.7)
Neutrophils Relative %: 68 %
Platelets: 193 10*3/uL (ref 150–400)
RBC: 4.18 MIL/uL (ref 3.87–5.11)
RDW: 13.5 % (ref 11.5–15.5)
WBC: 7.1 10*3/uL (ref 4.0–10.5)
nRBC: 0 % (ref 0.0–0.2)

## 2024-03-01 LAB — HCG, SERUM, QUALITATIVE: Preg, Serum: NEGATIVE

## 2024-03-01 NOTE — ED Triage Notes (Signed)
 Vaginal bleeding x 2 months Seen by provider today , exam concerning for large blood clots  Some abdo pain  Family used to interpret per patient request, offer profession interpreting serviced but refused by patient

## 2024-03-01 NOTE — ED Provider Notes (Signed)
 [place] EMERGENCY DEPARTMENT AT [facility] Provider Note   CSN: [id number] Arrival date & time: 03/01/24  1519     History {Add pertinent medical, surgical, social history, OB history to HPI:1} Chief Complaint  Patient presents with   Vaginal Bleeding    Shelly Reed is a 33 y.o. female.  Patient to ED from Urgent Care for vaginal bleeding. She reports persistent light to moderate vaginal bleeding x 2 months. No syncope. She has pelvic cramping. No fever, vomiting. She denies chance of pregnancy. History of similar symptoms in the past requiring medication.  The history is provided by the patient. No language interpreter was used.  Vaginal Bleeding      Home Medications Prior to Admission medications   Medication Sig Start Date End Date Taking? Authorizing Provider  furosemide  (LASIX ) 20 MG tablet Take 1 tablet (20 mg total) by mouth daily for 4 days. 10/02/23 10/06/23  Raynell Caller, MD  ibuprofen  (ADVIL ) 600 MG tablet Take 1 tablet (600 mg total) by mouth every 6 (six) hours as needed. 10/02/23   Raynell Caller, MD  Prenatal Vit-Fe Fumarate-FA (PRENATAL MULTIVITAMIN) TABS tablet Take 1 tablet by mouth daily at 12 noon. 10/02/23   Raynell Caller, MD      Allergies    Patient has no known allergies.    Review of Systems   Review of Systems  Genitourinary:  Positive for vaginal bleeding.    Physical Exam Updated Vital Signs BP 123/86   Pulse 67   Temp 98.1 F (36.7 C) (Oral)   Resp 16   SpO2 96%  Physical Exam Vitals and nursing note reviewed.  Constitutional:      General: She is not in acute distress.    Appearance: She is well-developed. She is not ill-appearing.  Eyes:     Comments: No conjunctival pallor  Cardiovascular:     Rate and Rhythm: Normal rate and regular rhythm.     Heart sounds: No murmur heard. Pulmonary:     Effort: Pulmonary effort is normal.     Breath sounds: No wheezing, rhonchi or rales.  Abdominal:      General: There is no distension.     Palpations: Abdomen is soft.     Tenderness: There is abdominal tenderness (Mildly tender in the suprapubic area.).  Musculoskeletal:        General: Normal range of motion.     Cervical back: Normal range of motion.  Skin:    General: Skin is warm and dry.     Coloration: Skin is not pale.  Neurological:     Mental Status: She is alert and oriented to person, place, and time.     ED Results / Procedures / Treatments   Labs (all labs ordered are listed, but only abnormal results are displayed) Labs Reviewed  CBC WITH DIFFERENTIAL/PLATELET  BASIC METABOLIC PANEL WITH GFR  HCG, SERUM, QUALITATIVE    EKG None  Radiology No results found.  Procedures Procedures  {Document cardiac monitor, telemetry assessment procedure when appropriate:1}  Medications Ordered in ED Medications - No data to display  ED Course/ Medical Decision Making/ A&P   {   Click here for ABCD2, HEART and other calculatorsREFRESH Note before signing :1}                              Medical Decision Making This patient presents to the ED for concern of vaginal bleeding, this involves an  extensive number of treatment options, and is a complaint that carries with it a high risk of complications and morbidity.  The differential diagnosis includes threatened miscarriage, fibroids, ovarian torsion, TOA, ectopic pregnancy   Co morbidities that complicate the patient evaluation  History of irregular vaginal bleeding   Additional history obtained:  Additional history and/or information obtained from chart review, notable for none   Lab Tests:  I Ordered, and personally interpreted labs.  The pertinent results include:  ***    Imaging Studies ordered:  I ordered imaging studies including n/a I independently visualized and interpreted imaging which showed n/a I agree with the radiologist interpretation   Cardiac Monitoring:  The patient was maintained on  a cardiac monitor.  I personally viewed and interpreted the cardiac monitored which showed an underlying rhythm of: n/a   Medicines ordered and prescription drug management:  I ordered medication including n/a  for n/a Reevaluation of the patient after these medicines showed that the patient stayed the same I have reviewed the patients home medicines and have made adjustments as needed   Test Considered:  ***   Critical Interventions:  ***   Consultations Obtained:  I requested consultation with the ***,  and discussed lab and imaging findings as well as pertinent plan - they recommend: ***   Problem List / ED Course:  Vaginal bleeding x 2 months Family reports she went to provider today and was sent here because she has a clot visualized on exam.    Reevaluation:  After the interventions noted above, I reevaluated the patient and found that they have :{resolved/improved/worsened:23923::"improved"}   Social Determinants of Health:  ***   Disposition:  After consideration of the diagnostic results and the patients response to treatment, I feel that the patient would benefit from ***.   Amount and/or Complexity of Data Reviewed Labs: ordered.   ***  {Document critical care time when appropriate:1} {Document review of labs and clinical decision tools ie heart score, Chads2Vasc2 etc:1}  {Document your independent review of radiology images, and any outside records:1} {Document your discussion with family members, caretakers, and with consultants:1} {Document social determinants of health affecting pt's care:1} {Document your decision making why or why not admission, treatments were needed:1} Final Clinical Impression(s) / ED Diagnoses Final diagnoses:  None    Rx / DC Orders ED Discharge Orders     None

## 2024-03-01 NOTE — Discharge Instructions (Signed)
 Seguimiento con ginecologa segn lo remitido para una evaluacin adicional del sangrado vaginal persistente. A pesar del sangrado persistente, usted se encuentra estable y seguro en casa hasta que lo vea un gineclogo, quien Education administrator la medicacin si es necesario.  Follow up with gynecology as referred for further evaluation of persistent vaginal bleeding. Despite the persistent bleeding your are stable and safe to be at home until seen by gynecology who will discuss medication if needed.

## 2024-04-07 ENCOUNTER — Ambulatory Visit (INDEPENDENT_AMBULATORY_CARE_PROVIDER_SITE_OTHER): Payer: Self-pay | Admitting: Obstetrics and Gynecology

## 2024-04-07 ENCOUNTER — Other Ambulatory Visit: Payer: Self-pay

## 2024-04-07 VITALS — BP 109/72 | HR 86 | Wt 157.6 lb

## 2024-04-07 DIAGNOSIS — R300 Dysuria: Secondary | ICD-10-CM

## 2024-04-07 DIAGNOSIS — N939 Abnormal uterine and vaginal bleeding, unspecified: Secondary | ICD-10-CM

## 2024-04-07 LAB — POCT URINALYSIS DIP (DEVICE)
Bilirubin Urine: NEGATIVE
Glucose, UA: NEGATIVE mg/dL
Hgb urine dipstick: NEGATIVE
Ketones, ur: NEGATIVE mg/dL
Leukocytes,Ua: NEGATIVE
Nitrite: NEGATIVE
Protein, ur: NEGATIVE mg/dL
Specific Gravity, Urine: >=1.03 (ref 1.005–1.030)
Urobilinogen, UA: 0.2 mg/dL (ref 0.0–1.0)
pH: 5.5 (ref 5.0–8.0)

## 2024-04-07 NOTE — Progress Notes (Signed)
 GYNECOLOGY VISIT  Patient name: Shelly Reed MRN [id number]  Date of birth: [birth year] Chief Complaint:   Vaginal Bleeding (Has been bleeding since baby, Baby is 5 months, bleeding has saturated pads and just went off 2 days ago. Every pregnancy when balloon is used, it causes bleeding to become heavy and last for long periods of time. Once went to [place] and was given small pills to help stop bleeding /When urinating now, theres burning sensation after all the bleeding )  History:  Shelly Reed is a 33 y.o. [id number] being seen today for AUB since delivery. Went to be seen and given birth control and it didn't help and the bleeding is the saemm. She went to a hospital 3x due to having heavy bleeding; 2 years ago went to [facility] and not sure which medications she was given but it helped. Has had it 3x; 4-5 months after delivery when started bleeding. Reports bleeding started when balloon and to break her water during her delivery. Since delivery, breastfed for maybe about 1 month and then discontinued. When she was having the bleeding, she noticed abdominal pain. It has been a week reports using 64 pads and they were saturated like the heavy depiction in the photo. 2 days now with no bleeding, yesterday had a little bleeding like the second picture on reference page. When the bleeding started lightening up, she started having burning with urination. During prior visit to hospial she reports they did a ultrasound and blood work but has not had an ultrasound since this most recent delivery. Prior ultrasound was done about 6-7 months after delivery. Was given OCPs, finished about a week ago and didn't take it due to having more frequent headaches.  When it stops and then it starts again and it gets worse and gets heavier. She reports she always has some amount of bleeding, typically a week at time.   After further elucidation, seems to be having amonthly heavy menses with  intermenstrual bleeding. Started bleeding during expected week of period and the bleeding was heavy and using 64 pads and it lasts for about 1.5 weeks twice a month.  Period is normal and light is response to asking about her bleeding; usually will have 2-3 days or normal bleeding and light bleeding.    Past Medical History:  Diagnosis Date   Anemia    Gestational hypertension, third trimester 01/16/2020   History of chlamydia infection 02/12/2021   Treated 01/15/21.     Ovarian cyst     Past Surgical History:  Procedure Laterality Date   CHOLECYSTECTOMY     OVARIAN CYST SURGERY      The following portions of the patient's history were reviewed and updated as appropriate: allergies, current medications, past family history, past medical history, past social history, past surgical history and problem list.   Health Maintenance:   Last pap 11/19/2022 NILM, HPV negative Last mammogram: n/a   Review of Systems:  Pertinent items are noted in HPI. Comprehensive review of systems was otherwise negative.   Objective:  Physical Exam BP 109/72 (BP Location: Left Arm, Patient Position: Sitting, Cuff Size: Large)   Pulse 86   Wt 157 lb 9.6 oz (71.5 kg)   SpO2 98%   BMI 28.83 kg/m    Physical Exam Vitals and nursing note reviewed.  Constitutional:      Appearance: Normal appearance.  HENT:     Head: Normocephalic and atraumatic.  Pulmonary:     Effort: Pulmonary  effort is normal.   Skin:    General: Skin is warm and dry.   Neurological:     General: No focal deficit present.     Mental Status: She is alert.   Psychiatric:        Mood and Affect: Mood normal.        Behavior: Behavior normal.        Thought Content: Thought content normal.        Judgment: Judgment normal.      Labs and Imaging CBC    Component Value Date/Time   WBC 7.1 03/01/2024 1633   RBC 4.18 03/01/2024 1633   HGB 12.9 03/01/2024 1633   HCT 36.8 03/01/2024 1633   PLT 193 03/01/2024 1633    MCV 88.0 03/01/2024 1633   MCH 30.9 03/01/2024 1633   MCHC 35.1 03/01/2024 1633   RDW 13.5 03/01/2024 1633   LYMPHSABS 1.7 03/01/2024 1633   MONOABS 0.5 03/01/2024 1633   EOSABS 0.2 03/01/2024 1633   BASOSABS 0.0 03/01/2024 1633       Assessment & Plan:   1. Abnormal uterine bleeding (AUB) (Primary) Appears to have persistent intermenstrual bleeding. Recommend repeat ultrasound to assess structural contributions to bleeding with exam at follow up. - US  PELVIC COMPLETE WITH TRANSVAGINAL; Future  2. Dysuria Urinalysis collected to assess for cystitis  - POCT urinalysis dip (device)    Routine preventative health maintenance measures emphasized.  Kiki Pelton, MD Minimally Invasive Gynecologic Surgery [facility], [facility]

## 2024-04-12 ENCOUNTER — Ambulatory Visit (HOSPITAL_COMMUNITY): Admission: RE | Admit: 2024-04-12 | Payer: Self-pay | Source: Ambulatory Visit

## 2024-05-18 ENCOUNTER — Ambulatory Visit: Payer: Self-pay | Admitting: Obstetrics and Gynecology

## 2024-07-21 ENCOUNTER — Ambulatory Visit: Payer: Self-pay | Admitting: Obstetrics and Gynecology
